# Patient Record
Sex: Male | Born: 1940
Health system: Southern US, Community
[De-identification: ages and names within clinical notes are randomized; demographics above are authoritative.]

## PROBLEM LIST (undated history)

## (undated) DIAGNOSIS — Z923 Personal history of irradiation: Secondary | ICD-10-CM

## (undated) DIAGNOSIS — L0201 Cutaneous abscess of face: Secondary | ICD-10-CM

## (undated) DIAGNOSIS — C07 Malignant neoplasm of parotid gland: Secondary | ICD-10-CM

## (undated) DIAGNOSIS — M1990 Unspecified osteoarthritis, unspecified site: Secondary | ICD-10-CM

## (undated) DIAGNOSIS — B029 Zoster without complications: Secondary | ICD-10-CM

## (undated) HISTORY — PX: WISDOM TOOTH EXTRACTION: SHX21

## (undated) HISTORY — PX: TONSILLECTOMY: SUR1361

## (undated) HISTORY — PX: MOUTH SURGERY: SHX715

## (undated) HISTORY — PX: OTHER SURGICAL HISTORY: SHX169

## (undated) HISTORY — PX: MOHS SURGERY: SUR867

---

## 2002-02-04 DIAGNOSIS — B029 Zoster without complications: Secondary | ICD-10-CM

## 2002-02-04 HISTORY — DX: Zoster without complications: B02.9

## 2003-01-25 ENCOUNTER — Encounter: Admission: RE | Admit: 2003-01-25 | Discharge: 2003-01-25 | Payer: Self-pay | Admitting: Family Medicine

## 2003-02-07 ENCOUNTER — Encounter: Admission: RE | Admit: 2003-02-07 | Discharge: 2003-02-07 | Payer: Self-pay | Admitting: General Surgery

## 2006-11-01 ENCOUNTER — Emergency Department (HOSPITAL_COMMUNITY): Admission: EM | Admit: 2006-11-01 | Discharge: 2006-11-02 | Payer: Self-pay | Admitting: Emergency Medicine

## 2014-08-02 ENCOUNTER — Encounter: Payer: Self-pay | Admitting: *Deleted

## 2015-08-02 DIAGNOSIS — Z83511 Family history of glaucoma: Secondary | ICD-10-CM | POA: Diagnosis not present

## 2015-08-02 DIAGNOSIS — H40013 Open angle with borderline findings, low risk, bilateral: Secondary | ICD-10-CM | POA: Diagnosis not present

## 2015-08-02 DIAGNOSIS — H40053 Ocular hypertension, bilateral: Secondary | ICD-10-CM | POA: Diagnosis not present

## 2017-01-08 DIAGNOSIS — L0201 Cutaneous abscess of face: Secondary | ICD-10-CM

## 2017-01-08 HISTORY — DX: Cutaneous abscess of face: L02.01

## 2017-01-20 DIAGNOSIS — L0201 Cutaneous abscess of face: Secondary | ICD-10-CM | POA: Diagnosis not present

## 2017-01-20 DIAGNOSIS — C44329 Squamous cell carcinoma of skin of other parts of face: Secondary | ICD-10-CM | POA: Diagnosis not present

## 2017-01-20 DIAGNOSIS — H6002 Abscess of left external ear: Secondary | ICD-10-CM | POA: Diagnosis not present

## 2017-02-04 DIAGNOSIS — C4490 Unspecified malignant neoplasm of skin, unspecified: Secondary | ICD-10-CM

## 2017-02-04 HISTORY — DX: Unspecified malignant neoplasm of skin, unspecified: C44.90

## 2017-02-11 DIAGNOSIS — C07 Malignant neoplasm of parotid gland: Secondary | ICD-10-CM | POA: Diagnosis not present

## 2017-02-11 HISTORY — DX: Malignant neoplasm of parotid gland: C07

## 2017-02-12 ENCOUNTER — Other Ambulatory Visit: Payer: Self-pay | Admitting: Otolaryngology

## 2017-02-12 DIAGNOSIS — C07 Malignant neoplasm of parotid gland: Secondary | ICD-10-CM

## 2017-02-17 ENCOUNTER — Ambulatory Visit
Admission: RE | Admit: 2017-02-17 | Discharge: 2017-02-17 | Disposition: A | Discharge status: Home / Self Care | Payer: Medicare Other | Source: Ambulatory Visit | Attending: Otolaryngology | Admitting: Otolaryngology

## 2017-02-17 DIAGNOSIS — C07 Malignant neoplasm of parotid gland: Secondary | ICD-10-CM

## 2017-02-17 MED ORDER — IOPAMIDOL (ISOVUE-300) INJECTION 61%
75.0000 mL | Freq: Once | INTRAVENOUS | Status: AC | PRN
  Administered 2017-02-17: 75 mL via INTRAVENOUS

## 2017-02-19 DIAGNOSIS — C44309 Unspecified malignant neoplasm of skin of other parts of face: Secondary | ICD-10-CM | POA: Diagnosis not present

## 2017-02-19 DIAGNOSIS — Z01818 Encounter for other preprocedural examination: Secondary | ICD-10-CM | POA: Diagnosis not present

## 2017-02-21 DIAGNOSIS — Z01818 Encounter for other preprocedural examination: Secondary | ICD-10-CM | POA: Diagnosis not present

## 2017-02-21 DIAGNOSIS — C44300 Unspecified malignant neoplasm of skin of unspecified part of face: Secondary | ICD-10-CM | POA: Diagnosis not present

## 2017-02-25 DIAGNOSIS — C089 Malignant neoplasm of major salivary gland, unspecified: Secondary | ICD-10-CM | POA: Diagnosis not present

## 2017-02-25 DIAGNOSIS — C44229 Squamous cell carcinoma of skin of left ear and external auricular canal: Secondary | ICD-10-CM | POA: Diagnosis not present

## 2017-02-25 DIAGNOSIS — C07 Malignant neoplasm of parotid gland: Secondary | ICD-10-CM | POA: Diagnosis not present

## 2017-02-25 DIAGNOSIS — D360 Benign neoplasm of lymph nodes: Secondary | ICD-10-CM | POA: Diagnosis not present

## 2017-02-25 DIAGNOSIS — L989 Disorder of the skin and subcutaneous tissue, unspecified: Secondary | ICD-10-CM | POA: Diagnosis not present

## 2017-02-25 DIAGNOSIS — C44329 Squamous cell carcinoma of skin of other parts of face: Secondary | ICD-10-CM | POA: Diagnosis not present

## 2017-02-25 HISTORY — PX: OTHER SURGICAL HISTORY: SHX169

## 2017-02-25 HISTORY — PX: PAROTIDECTOMY: SUR1003

## 2017-02-25 HISTORY — PX: NECK DISSECTION: SUR422

## 2017-03-10 ENCOUNTER — Encounter: Payer: Self-pay | Admitting: Radiation Oncology

## 2017-03-13 NOTE — Progress Notes (Signed)
Head and Neck Cancer Location of Tumor / Histology:  02/25/17 FINAL PATHOLOGIC DIAGNOSIS MICROSCOPIC EXAMINATION AND DIAGNOSIS B.LEFT PREAURICULAR SKIN CARCINOMA AND PAROTIDECTOMY, RESECTION OF SKIN AND SOFT TISSUE AND TOTAL PAROTIDECTOMY: Squamous cell carcinoma, moderately differentiated, invasive of the preauricular skin to involve the deep subcutaneous tissues and the parotid glandular tissue. Tumor size: 2.0 x 1.6 x 1.1 cm Perineural invasion identified. Nine regional lymph nodes all negative for malignancy (0/9). All skin and soft tissue margins are negative for malignancy. See tumor protocol summary. A.LEFT EAR CANAL MARGIN, EXCISION: Negative for malignancy. C.LEFT NECK CONTENTS, LEVEL IIB LYMPH NODES, DISSECTION: Three lymph nodes all negative for tumor (0/3). D.LEFT NECK CONTENTS, LEVEL I, 2A, 3 LYMPH NODES, DISSECTION: Thirteen lymph nodes all negative for tumor (0/13).  Patient presented with symptoms of: He presented to Dr. Janace Hoard on 01/08/17 with a left facial lesion that had been present for several months that had become more painful and enlarged 2 weeks before the visit with Dr. Janace Hoard. He was referred to Dr. Conley Canal and saw him initially on 02/19/17 for evaluation for surgery.   Biopsies revealed: Squamous cell carcinoma, moderately differentiated, invasive of the preauricular skin to involve thedeep subcutaneous tissues and the parotid glandular tissue.  Nutrition Status Yes No Comments  Weight changes? []  [x]    Swallowing concerns? []  [x]    PEG? []  [x]     Referrals Yes No Comments  Social Work? []  [x]    Dentistry? []  [x]    Swallowing therapy? []  [x]    Nutrition? []  [x]    Med/Onc? []  [x]     Safety Issues Yes No Comments  Prior radiation? []  [x]    Pacemaker/ICD? []  [x]    Possible current pregnancy? []  [x]    Is the patient on methotrexate? []  [x]     Tobacco/Marijuana/Snuff/ETOH use: He has never  smoked or used smokeless tobacco. He has not had any alcohol since his surgery 02/25/17  Past/Anticipated interventions by otolaryngology, if any:  02/25/17 Dr. Conley Canal Procedures/Surgeries performed during hospitalization:  PR EXC PAROTD,LAT LOBE,DISSECT 5TH NERV [42415] (PAROTIDECTOMY) AURICULECTOMY CERVICOFACIAL ROTATION ADVANCEMENT FLAP    Past/Anticipated interventions by medical oncology, if any:  N/A   Current Complaints / other details:   He reports a "sensitive area" to his surgical site when shaving. His site has healed well.   02/17/17 CT neck IMPRESSION: 1.9 x 1.7 x 2.5 cm irregularly marginated mass anterior to the left ear. Inseparable from the upper lateral corner the superficial lobe of left parotid gland. The lesion could have arisen within the parotid gland or, more likely, within the adjacent soft tissues. No evidence of pathologic nodes by CT size criteria. Small node just posterior and inferior to the left parotid gland viewed with mild suspicion based on mild enhancement  BP (!) 144/92   Pulse 74   Temp 98 F (36.7 C)   Ht 6' (1.829 m)   Wt 195 lb 9.6 oz (88.7 kg)   SpO2 99% Comment: room air  BMI 26.53 kg/m    Wt Readings from Last 3 Encounters:  03/18/17 195 lb 9.6 oz (88.7 kg)

## 2017-03-14 ENCOUNTER — Encounter: Payer: Self-pay | Admitting: Radiation Oncology

## 2017-03-15 ENCOUNTER — Telehealth: Payer: Self-pay | Admitting: *Deleted

## 2017-03-15 NOTE — Telephone Encounter (Signed)
Oncology Nurse Navigator Documentation  Placed introductory call to new referral patient.  Introduced myself as the H&N oncology nurse navigator that works with Dr. Isidore Moos to whom he has been referred by Dr. Conley Canal, [facility].  He confirmed understanding of referral and appt date/time of 2/12 12:30 for NE, 1:00 [facility] consult.  I briefly explained my role as his navigator, indicated I would be joining him during his appt next week.  I confirmed his understanding of [place] location, explained arrival and RadOnc registration process for appt.  Answered his questions re RT.  I provided my contact information, encouraged him to call with questions/concerns before next week.  He verbalized understanding of information provided, expressed appreciation for my call.  Gayleen Orem, RN, BSN Head & Neck Oncology Nurse [place] at [place] [phone]

## 2017-03-18 ENCOUNTER — Ambulatory Visit
Admission: RE | Admit: 2017-03-18 | Discharge: 2017-03-18 | Disposition: A | Discharge status: Home / Self Care | Payer: Medicare Other | Source: Ambulatory Visit | Attending: Radiation Oncology | Admitting: Radiation Oncology

## 2017-03-18 ENCOUNTER — Encounter: Payer: Self-pay | Admitting: Radiation Oncology

## 2017-03-18 ENCOUNTER — Encounter: Payer: Self-pay | Admitting: *Deleted

## 2017-03-18 VITALS — BP 144/92 | HR 74 | Temp 98.0°F | Ht 72.0 in | Wt 195.6 lb

## 2017-03-18 DIAGNOSIS — Z8619 Personal history of other infectious and parasitic diseases: Secondary | ICD-10-CM | POA: Diagnosis not present

## 2017-03-18 DIAGNOSIS — C44300 Unspecified malignant neoplasm of skin of unspecified part of face: Secondary | ICD-10-CM

## 2017-03-18 DIAGNOSIS — R200 Anesthesia of skin: Secondary | ICD-10-CM | POA: Diagnosis not present

## 2017-03-18 DIAGNOSIS — C4490 Unspecified malignant neoplasm of skin, unspecified: Secondary | ICD-10-CM | POA: Diagnosis not present

## 2017-03-18 DIAGNOSIS — Z1329 Encounter for screening for other suspected endocrine disorder: Secondary | ICD-10-CM

## 2017-03-18 DIAGNOSIS — J358 Other chronic diseases of tonsils and adenoids: Secondary | ICD-10-CM | POA: Diagnosis not present

## 2017-03-18 DIAGNOSIS — C44329 Squamous cell carcinoma of skin of other parts of face: Secondary | ICD-10-CM | POA: Diagnosis not present

## 2017-03-18 DIAGNOSIS — Z9049 Acquired absence of other specified parts of digestive tract: Secondary | ICD-10-CM | POA: Diagnosis not present

## 2017-03-18 HISTORY — DX: Zoster without complications: B02.9

## 2017-03-18 HISTORY — DX: Cutaneous abscess of face: L02.01

## 2017-03-18 HISTORY — DX: Malignant neoplasm of parotid gland: C07

## 2017-03-18 NOTE — Progress Notes (Signed)
Radiation Oncology         [phone] ________________________________  Initial outpatient Consultation  Name: Timothy Weaver MRN: [id number]  Date: 03/18/2017  DOB: 1940/10/17  VZ:DGLOVFI, No Pcp Per  [name], *   REFERRING PHYSICIAN: [name], *  DIAGNOSIS:    ICD-10-CM   1. Skin cancer of face C44.300 TSH    CBC with Differential/Platelet    Basic metabolic panel    CT Chest W Contrast    Ambulatory referral to Social Work    Ambulatory referral to Physical Therapy    Amb Referral to Nutrition and Diabetic E    Ambulatory referral to Dentistry  2. Screening for hypothyroidism Z13.29 TSH  3. Cancer of skin of face C44.300    Skin cancer of the face invading the parotid gland on the left Cancer Staging Cancer of skin of face Staging form: Cutaneous Carcinoma of the Head and Neck, AJCC 8th Edition - Clinical: Stage III (cT3, cN0, cM0) - Signed by Eppie Gibson, MD on 03/19/2017 +PNI, negative LVSI, moderately differentiated, negative margins  CHIEF COMPLAINT: Here to discuss management of his squamous cell cancer of the parotid.  HISTORY OF PRESENT ILLNESS::Timothy Weaver is a 77 y.o. male who presented to ENT on 01/08/17 with a left preauricular lesion (small, mobile, oblong cyst in appearance and initial subcutaneous) present for several months. The lesion had been enlarging and growing more painful for the prior 2 weeks, prompting patient's visit. Noted during this first visit was increasing erythema, reports of spontaneous drainage, and increasing swelling. Patient was given abx and returned for f/u on 01/20/17 and underwent I&D. Pathology showed squamous cell carcinoma and patient was referred to Dr. Conley Canal on 02/19/17.  Pertinent imaging thus far includes neck CT performed on 02/17/17 revealing 1.9 x 1.7 x 2.5 cm irregularly marginated mass anterior to the left ear. Inseparable from the upper lateral corner of the superficial lobe of left  parotid gland. No evidence of pathologic nodes by CT size criteria. Small node just posterior and inferior to the left parotid gland viewed with mild suspicion based on mild enhancement. The patient has not had any CT imaging of his chest recently.  I have personally reviewed the patient's images.  Dr. Conley Canal assessed that the patient had squamous cell carcinoma of the skin which had deep extension to the parotid.  On 02/25/17 he performed a left preauricular skin carcinoma and parotidectomy, resection of skin and soft tissue and total parotidectomy. Pathology showed squamous cell carcinoma, moderately differentiated, invasive with tumor size 2.0 x 1.6 x 1.1 cm with perineural invasion. 0/25 lymph nodes were positive for malignancy. All margins were clear by at least 0.6 cm.  Swallowing issues, if any: Denies.  Weight Changes: Denies.  Pain status: Endorses some pain in his left ear since surgery.  Tobacco history, if any: Denies.  ETOH abuse, if any: None since surgery Jan 2019.  Prior cancers, if any: None  On review of systems, patient endorses swelling and redness around time of initial consults. Patient denies any history of skin cancer prior to this case. Denies recent full body skin exam by dermatologist. Denies any numbness, tingling, or weakness in face before surgery. Endorses tingling sensation where hair was shaved at left temporal scalp. Endorses numbness over left cheek since surgery. Denies any history of diabetes.  Reports significant sunburns in early adulthood and as a child.  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    PAST SURGICAL HISTORY: Past Surgical History:  Procedure Laterality Date  . auriculectomy Left 02/25/2017   Dr. [name]- [facility].   . hearing loss    . MOUTH SURGERY    . NECK DISSECTION Left  02/25/2017   Dr. [name], [facility].   Marland Kitchen PAROTIDECTOMY Left 02/25/2017   Dr. [name] [facility]  . TONSILLECTOMY    . WISDOM TOOTH EXTRACTION      FAMILY HISTORY: he does not report related medical conditions in his family  SOCIAL HISTORY:  reports that  has never smoked. he has never used smokeless tobacco. He reports that he drinks alcohol. He reports that he does not use drugs.  ALLERGIES: Patient has no known allergies.  MEDICATIONS:  Current Outpatient Medications  Medication Sig Dispense Refill  . acetaminophen (TYLENOL) 500 MG tablet Take 500 mg by mouth every 6 (six) hours as needed.    Marland Kitchen ibuprofen (ADVIL,MOTRIN) 400 MG tablet Take 400 mg by mouth every 6 (six) hours as needed.    Marland Kitchen oxyCODONE (OXY IR/ROXICODONE) 5 MG immediate release tablet Take 5 mg by mouth every 4 (four) hours as needed for moderate pain or severe pain. Take one tablet by mouth every 6 hours as needed for up to 7 days for moderate pain or severe pain.     No current facility-administered medications for this encounter.     REVIEW OF SYSTEMS:  Notable for that above.   PHYSICAL EXAM:  height is 6' (1.829 m) and weight is 195 lb 9.6 oz (88.7 kg). His temperature is 98 F (36.7 C). His blood pressure is 144/92 (abnormal) and his pulse is 74. His oxygen saturation is 99%.   General: Alert and oriented, in no acute distress HEENT: Head is normocephalic. Extraocular movements are intact. Oropharynx is notable for small tag on tip of oral tongue secondary to biting several years ago. No notable lesions. Metallic work noted in his teeth.  Left eyebrow does not elevate with right eyebrow. Very slight droop of left mouth. Surgical scar extends from left cheek to the external ear canal and down the cervical neck. The scar is mildly erythematous but is healing well.  Neck: Neck is notable for no palpable masses in periparotid regions or neck bilaterally. Heart: Regular in rate and rhythm with no murmurs,  rubs, or gallops. Chest: Clear to auscultation bilaterally, with no rhonchi, wheezes, or rales. Abdomen: Soft, nontender, nondistended, with no rigidity or guarding. Normal bowel sounds. Extremities: No cyanosis or edema. Lymphatics: see Neck Exam Skin: No concerning lesions. Signs of chronic sun exposure. Musculoskeletal: symmetric strength and muscle tone throughout. Neurologic: Cranial nerves II through XII are grossly intact. No obvious focalities. Speech is fluent. Coordination is intact. Psychiatric: Judgment and insight are intact. Affect is appropriate.   ECOG = 0  0 - Asymptomatic (Fully active, able to carry on all predisease activities without restriction)  1 - Symptomatic but completely ambulatory (Restricted in physically strenuous activity but ambulatory and able to carry out work of a light or sedentary nature. For example, light housework, office work)  2 - Symptomatic, <50% in bed during the day (Ambulatory and capable of all self care but unable to carry out any work activities. Up and about more than 50% of waking hours)  3 - Symptomatic, >50% in bed, but not bedbound (Capable of only limited self-care, confined to bed or chair 50% or more of waking  hours)  4 - Bedbound (Completely disabled. Cannot carry on any self-care. Totally confined to bed or chair)  5 - Death   Eustace Pen MM, Creech RH, Tormey DC, et al. [phone]). "Toxicity and response criteria of the [facility]". [place] Oncol. 5 (6): 649-55   LABORATORY DATA:  No results found for: WBC, HGB, HCT, MCV, PLT CMP  No results found for: NA, K, CL, CO2, GLUCOSE, BUN, CREATININE, CALCIUM, PROT, ALBUMIN, AST, ALT, ALKPHOS, BILITOT, GFRNONAA, GFRAA       RADIOGRAPHY: Ct Soft Tissue Neck W Contrast  Result Date: 02/17/2017 CLINICAL DATA:  New diagnosis of squamous cell carcinoma the parotid gland. Creatinine was obtained on site at [place] at [address] Results:  Creatinine 0.7 mg/dL. EXAM: CT NECK WITH CONTRAST TECHNIQUE: Multidetector CT imaging of the neck was performed using the standard protocol following the bolus administration of intravenous contrast. CONTRAST:  86mL ISOVUE-300 IOPAMIDOL (ISOVUE-300) INJECTION 61% COMPARISON:  None. FINDINGS: Pharynx and larynx: No mucosal or submucosal lesion. Prominence of the right tonsil relative to the left with tonsillar stones. Salivary glands: Right parotid gland is normal. There is a poorly defined mass in the superior aspect of the superficial lobe of the left parotid measuring 1.9 x 1.7 x 2.5 cm in diameter. This could have arisen in the extreme upper superficial corner of the parotid gland or could have arisen in the soft tissues adjacent to the parotid gland. Tumor extends backwards to the anterior wall the external auditory canal. Parotid gland otherwise appears normal with some ordinary appearing parotid nodes, symmetric to the other side. Submandibular glands are normal and symmetric. Thyroid: Normal Lymph nodes: No enlarged or low-density nodes on either side of the neck. Single slightly suspicious node just inferior and posterior to the left parotid gland, axial image 30. This is not enlarged by size criteria but may enhance slightly. Vascular: Normal Limited intracranial: Normal Visualized orbits: Normal Mastoids and visualized paranasal sinuses: Clear Skeleton: Ordinary cervical spondylosis. Upper chest: Normal Other: None IMPRESSION: 1.9 x 1.7 x 2.5 cm irregularly marginated mass anterior to the left ear. Inseparable from the upper lateral corner the superficial lobe of left parotid gland. The lesion could have arisen within the parotid gland or, more likely, within the adjacent soft tissues. No evidence of pathologic nodes by CT size criteria. Small node just posterior and inferior to the left parotid gland viewed with mild suspicion based on mild enhancement. Electronically Signed   By: Nelson Chimes M.D.   On:  02/17/2017 15:40      IMPRESSION/PLAN: This is a delightful patient with head and neck cancer. It is not clear if this was a subcutaneous lymph node or a primary skin cancer.   I recommend adjuvant radiotherapy for this patient and anticipate 6 weeks of treatment to begin within 6 weeks after surgery. I will schedule a chest CT early next week to rule out lung metastases.   He is scheduled to meet with Dr. Conley Canal in ENT next Monday. I will also refer the patient for a full body skin exam with dermatology.  We discussed the potential risks, benefits, and side effects of radiotherapy. We talked in detail about acute and late effects. We discussed that some of the most bothersome acute effects may be mucositis, dysgeusia, salivary changes, skin irritation, hair loss, dehydration, weight loss and fatigue. We talked about late effects which include but are not necessarily limited to dysphagia, hypothyroidism, nerve injury, spinal cord injury, xerostomia,  trismus, and neck edema. No guarantees of treatment were given. A consent form was signed and placed in the patient's medical record. The patient is enthusiastic about proceeding with treatment. I look forward to participating in the patient's care.     Simulation (treatment planning) will take place after dental clearance and CT of chest   We also discussed that the treatment of head and neck cancer is a multidisciplinary process to maximize treatment outcomes and quality of life. For this reasons the following referrals have been or will be made:   Dermatology referral for full body skin exam    Dentistry for dental evaluation, possible scatter guards, advice on reducing risk of cavities, osteoradionecrosis, or other oral issues. I don't think extractions will be warranted, as the RT will not deliver high doses to tooth roots.   Nutritionist for nutrition support during and after treatment.   Social work for social support.    Physical therapy  due to risk of lymphedema in neck and deconditioning.   Baseline labs including TSH.  I spent 50 minutes face to face with the patient, over 50% of the time on counseling and care coordination. __________________________________________   Eppie Gibson, MD   This document serves as a record of services personally performed by Eppie Gibson, MD. It was created on his behalf by Linward Natal, a trained medical scribe. The creation of this record is based on the scribe's personal observations and the provider's statements to them. This document has been checked and approved by the attending provider.

## 2017-03-18 NOTE — Progress Notes (Signed)
Oncology Nurse Navigator Documentation  Met with patient during initial consult with Dr. Isidore Moos.  He was accompanied by his wife.  1. Further introduced myself as his Navigator, explained my role as a member of the Care Team.   2. Provided New Patient Information packet, discussed contents:  Contact information for physician(s), myself, other members of the Care Team.  Advance Directive information ([place] blue pamphlet with LCSW contact info)  Fall Prevention Patient [place] sheet  [place] campus map with highlight of [place] 3. He stated he has post-surgical follow-up with Dr. Conley Canal, [facility], 2/18. 4. Provided introductory explanation of radiation treatment including SIM planning and purpose of Aquaplast head and shoulder mask, showed them example.   5. Discussed his attendance at 2/26 H&N [place].  He voiced understanding I will provide additional information. 6. Provided a tour of SIM and [place] areas, explained treatment and arrival procedures. 7. I encouraged them to contact me with questions/concerns as treatments/procedures begin.  They verbalized understanding of information provided.    Gayleen Orem, RN, BSN Head & Neck Oncology Nurse [place] at [place] [phone]

## 2017-03-19 ENCOUNTER — Encounter: Payer: Self-pay | Admitting: Radiation Oncology

## 2017-03-19 ENCOUNTER — Other Ambulatory Visit: Payer: Self-pay | Admitting: Radiation Oncology

## 2017-03-19 DIAGNOSIS — C44300 Unspecified malignant neoplasm of skin of unspecified part of face: Secondary | ICD-10-CM | POA: Insufficient documentation

## 2017-03-20 ENCOUNTER — Ambulatory Visit (HOSPITAL_COMMUNITY): Payer: Self-pay | Admitting: Dentistry

## 2017-03-20 ENCOUNTER — Encounter (HOSPITAL_COMMUNITY): Payer: Self-pay | Admitting: Dentistry

## 2017-03-20 ENCOUNTER — Other Ambulatory Visit: Payer: Self-pay | Admitting: Radiation Oncology

## 2017-03-20 ENCOUNTER — Telehealth: Payer: Self-pay | Admitting: *Deleted

## 2017-03-20 ENCOUNTER — Other Ambulatory Visit (HOSPITAL_COMMUNITY): Payer: Medicare Other | Admitting: Dentistry

## 2017-03-20 VITALS — BP 144/90 | HR 70 | Temp 98.0°F

## 2017-03-20 DIAGNOSIS — K0530 Chronic periodontitis, unspecified: Secondary | ICD-10-CM

## 2017-03-20 DIAGNOSIS — K08409 Partial loss of teeth, unspecified cause, unspecified class: Secondary | ICD-10-CM

## 2017-03-20 DIAGNOSIS — K031 Abrasion of teeth: Secondary | ICD-10-CM

## 2017-03-20 DIAGNOSIS — C44300 Unspecified malignant neoplasm of skin of unspecified part of face: Secondary | ICD-10-CM

## 2017-03-20 DIAGNOSIS — C44329 Squamous cell carcinoma of skin of other parts of face: Secondary | ICD-10-CM | POA: Diagnosis not present

## 2017-03-20 DIAGNOSIS — Z01818 Encounter for other preprocedural examination: Secondary | ICD-10-CM

## 2017-03-20 DIAGNOSIS — K036 Deposits [accretions] on teeth: Secondary | ICD-10-CM

## 2017-03-20 DIAGNOSIS — Z0120 Encounter for dental examination and cleaning without abnormal findings: Secondary | ICD-10-CM | POA: Diagnosis not present

## 2017-03-20 DIAGNOSIS — K06010 Localized gingival recession, unspecified: Secondary | ICD-10-CM

## 2017-03-20 MED ORDER — SODIUM FLUORIDE 1.1 % DT GEL: DENTAL | 11 refills | Status: DC

## 2017-03-20 MED FILL — SF 1.1% GEL: 1.1 | 30 days supply | Qty: 112 | Fill #0

## 2017-03-20 NOTE — Patient Instructions (Signed)

## 2017-03-20 NOTE — Telephone Encounter (Signed)
Called patient to inform of appt. for Ct on 03-25-17- arrival time - 2:15 pm , pt. To have water only - 4 hrs. Prior to test, test to be @ [facility] Radiology pt. To have labs on 03-25-17 @ 1:15 pm @ [place], and patient to have a consultation on 06-18-17 - arrival time - 2:40 pm @ Dr. Wilhemina Bonito' Office, address- [place], ph. No. [phone], lvm for a return call

## 2017-03-20 NOTE — Progress Notes (Signed)
DENTAL CONSULTATION  Date of Consultation:  03/20/2017 Patient Name:   Timothy Weaver Date of Birth:   Jun 16, 1940 Medical Record Number: [id number]  VITALS: BP (!) 144/90 (BP Location: Right Arm)   Pulse 70   Temp 98 F (36.7 C)   CHIEF COMPLAINT: Patient referred by Dr. Isidore Moos for a dental consultation.  HPI: Timothy Weaver is a 77 year old male recently diagnosed with cancer involving the skin of the face. Patient underwent surgical resection with Dr. Conley Canal at [facility]. Patient with anticipated radiation therapy with Dr. Isidore Moos. Patient now seen as part of a medically necessary preradiation therapy dental protocol examination.  The patient currently denies acute toothaches, swellings, or abscesses. Patient was last seen in November 2018 for an exam and cleaning with Dr. Nicki Reaper Minor.  The patient is usually seen on an every 6 month basis. Patient's last dental radiographs were a full series taken in July of 2014.  Patient denies having partial dentures. Patient denies having any unmet dental needs. Patient denies having dental phobia.  Patient indicates that he had root canal therapy on tooth #30 in August 2014 with Dr. Sue Lush.  The patient denies having any sensitivity to percussion or palpation in this area. The patient is aware of the multiple flexure lesions in his mouth at this time.   PROBLEM LIST: Patient Active Problem List   Diagnosis Date Noted  . Cancer of skin of face 03/19/2017    PMH: Past Medical History:  Diagnosis Date  . Facial abscess 01/08/2017  . Shingles 2004  . 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 auriculectomy Left 02/25/2017   Dr. [name]- [facility].   . hearing loss    . MOUTH SURGERY    . NECK DISSECTION Left 02/25/2017   Dr. [name], [facility].   Marland Kitchen PAROTIDECTOMY Left 02/25/2017   Dr. [name] [facility]  . TONSILLECTOMY    .  WISDOM TOOTH EXTRACTION      ALLERGIES: No Known Allergies  MEDICATIONS: Current Outpatient Medications  Medication Sig Dispense Refill  . acetaminophen (TYLENOL) 500 MG tablet Take 500 mg by mouth every 6 (six) hours as needed.    Marland Kitchen ibuprofen (ADVIL,MOTRIN) 400 MG tablet Take 400 mg by mouth every 6 (six) hours as needed.    Marland Kitchen oxyCODONE (OXY IR/ROXICODONE) 5 MG immediate release tablet Take 5 mg by mouth every 4 (four) hours as needed for moderate pain or severe pain. Take one tablet by mouth every 6 hours as needed for up to 7 days for moderate pain or severe pain.     No current facility-administered medications for this visit.     LABS: No results found for: WBC, HGB, HCT, MCV, PLT No results found for: NA, K, CL, CO2, GLUCOSE, BUN, CREATININE, CALCIUM, GFRNONAA, GFRAA No results found for: INR, PROTIME No results found for: PTT  SOCIAL HISTORY: Social History   Socioeconomic History  . Marital status: Married    Spouse name: Not on file  . Number of children: Not on file  . Years of education: Not on file  . Highest education level: Not on file  Social Needs  . Financial resource strain: Not on file  . Food insecurity - worry: Not on file  . Food insecurity - inability: Not on file  . Transportation needs - medical: Not on file  . Transportation needs - non-medical: Not on file  Occupational History  . Not  on file  Tobacco Use  . Smoking status: Never Smoker  . Smokeless tobacco: Never Used  Substance and Sexual Activity  . Alcohol use: Yes    Comment: occasional. not since surgery 02/2017  . Drug use: No  . Sexual activity: Not on file  Other Topics Concern  . Not on file  Social History Narrative  . Not on file    FAMILY HISTORY: History reviewed. No pertinent family history.  REVIEW OF SYSTEMS: Reviewed with the patient as per History of present illness. Psych: Patient denies having dental phobia.  DENTAL HISTORY: CHIEF COMPLAINT: Patient referred  by Dr. Isidore Moos for a dental consultation.  HPI: Timothy Weaver is a 77 year old male recently diagnosed with cancer involving the skin of the face. Patient underwent surgical resection with Dr. Conley Canal at [facility]. Patient with anticipated radiation therapy with Dr. Isidore Moos. Patient now seen as part of a medically necessary preradiation therapy dental protocol examination.  The patient currently denies acute toothaches, swellings, or abscesses. Patient was last seen in November 2018 for an exam and cleaning with Dr. Nicki Reaper Minor.  The patient is usually seen on an every 6 month basis. Patient's last dental radiographs were a full series taken in July of 2014.  Patient denies having partial dentures. Patient denies having any unmet dental needs. Patient denies having dental phobia.  Patient indicates that he had root canal therapy on tooth #30 in August 2014 with Dr. Sue Lush.  The patient denies having any sensitivity to percussion or palpation in this area. The patient is aware of the multiple flexure lesions in his mouth at this time.   DENTAL EXAMINATION: GENERAL:  The patient is a well-developed, well-nourished male in no acute distress. HEAD AND NECK:  The left neck and face are consistent with previous surgical resection by Dr. Conley Canal. There is no right neck lymphadenopathy. The patient denies acute TMJ symptoms. INTRAORAL EXAM:  The patient has normal saliva. There is no evidence of oral abscess formation. DENTITION:  Patient is missing tooth numbers 1, 16, 17, and 32 that were extracted when he was approximately 77 years of age. PERIODONTAL:  Patient has chronic periodontitis with no significant plaque accumulations, selective areas of gingival recession, and no significant tooth mobility. DENTAL CARIES/SUBOPTIMAL RESTORATIONS: There are no obvious dental caries are noted. Patient has multiple flexure lesions associated with tooth numbers 4, 5, 6, 11, 19, 20, 21, 28, and  29. ENDODONTIC:  Patient currently denies acute pulpitis symptoms. Patient has previous root canal therapy associated with tooth #30. There appears to be a persistent periapical radiolucency associated with the apex of tooth #30. Patient again denies any sensitivity to percussion or palpation. I had a discussion with Dr. Sue Lush who performed root canal therapy in August 2014. Patient did have a significant periapical radiolucency at that time.  Dr. Sue Lush is willing to reevaluate the tooth for additional therapy if indicated. CROWN AND BRIDGE:  The patient has multiple crown restorations on tooth numbers 3, 12, 13, 14, 30, and 31. PROSTHODONTIC:  There are no partial dentures. OCCLUSION:  The patient has a stable occlusion.  RADIOGRAPHIC INTERPRETATION: An orthopantogram was taken and supplemented with a full series of dental radiographs. There are missing tooth numbers 1, 16, 17, and 32. There is incipient bone loss noted. There is a persistent periapical radiolucency at the apex of tooth #30. Tooth #30 as had a previous root canal therapy. Multiple resin, amalgams, and crown and bridge restorations are noted.   ASSESSMENTS: 1.  Cancer of the skin of the left face-status post surgical resection. 2. Preradiation therapy dental protocol 3. Chronic periodontitis with minimal accretions 4. Missing tooth numbers 1, 16, 17, and 32. 5. Multiple flexure lesions 6. Persistent periapical radiolucency at the apex of tooth #30 after root canal therapy in 2014 with no acute symptoms. 7. Stable occlusion  PLAN/RECOMMENDATIONS: 1. I discussed the risks, benefits, and complications of various treatment options with the patient in relationship to his medical and dental conditions, anticipated radiation therapy, and radiation therapy side effects to include xerostomia, radiation caries, trismus, mucositis, taste changes, gum and jawbone changes, and risk for infection and osteoradionecrosis. We discussed  various treatment options to include no treatment, periodontal therapy, dental restorations, root canal therapy, crown and bridge therapy, implant therapy, and replacement of missing teeth as indicated. We also discussed fabrication of fluoride trays and scatter protection devices. We also discussed referral to an endodontist for reevaluation of the root canal therapy on tooth #30.  Patient currently wishes to defer referral to the endodontist at this time. The patient did wish to proceed with impressions today for the future fabrication of fluoride trays and scatter protection devices.  Patient is to return to Dental Medicine for Tuesday, 03/25/2017 at 8:30 am. A prescription for FluoriSHIELD sodium fluoride was sent to [facility] with refills for one year. The patient expresses understanding with the need for evaluation for restoration of his flexure lesions with Dr. Nicki Reaper Minor once he is medically stable from the anticipated radiation therapy.  Patient also is aware of the need to follow-up with  Dr. Sue Lush for evaluation of the previous root canal therapy in the area of tooth #30 after the radiation therapy.  2. Discussion of findings with medical team and coordination of future medical and dental care as needed.  I spent in excess of  120 minutes during the conduct of this consultation and >50% of this time involved direct face-to-face encounter for counseling and/or coordination of the patient's care.    Lenn Cal, DDS

## 2017-03-25 ENCOUNTER — Ambulatory Visit (HOSPITAL_COMMUNITY)
Admission: RE | Admit: 2017-03-25 | Discharge: 2017-03-25 | Disposition: A | Discharge status: Home / Self Care | Payer: Medicare Other | Source: Ambulatory Visit | Attending: Radiation Oncology | Admitting: Radiation Oncology

## 2017-03-25 ENCOUNTER — Encounter (HOSPITAL_COMMUNITY): Payer: Self-pay

## 2017-03-25 ENCOUNTER — Encounter (HOSPITAL_COMMUNITY): Payer: Self-pay | Admitting: Dentistry

## 2017-03-25 ENCOUNTER — Ambulatory Visit
Admission: RE | Admit: 2017-03-25 | Discharge: 2017-03-25 | Disposition: A | Discharge status: Home / Self Care | Payer: Medicare Other | Source: Ambulatory Visit | Attending: Radiation Oncology | Admitting: Radiation Oncology

## 2017-03-25 ENCOUNTER — Ambulatory Visit (HOSPITAL_COMMUNITY): Payer: Medicaid - Dental | Admitting: Dentistry

## 2017-03-25 VITALS — BP 147/82 | HR 68 | Temp 98.2°F

## 2017-03-25 DIAGNOSIS — Z463 Encounter for fitting and adjustment of dental prosthetic device: Secondary | ICD-10-CM

## 2017-03-25 DIAGNOSIS — Z1329 Encounter for screening for other suspected endocrine disorder: Secondary | ICD-10-CM

## 2017-03-25 DIAGNOSIS — Z510 Encounter for antineoplastic radiation therapy: Secondary | ICD-10-CM | POA: Insufficient documentation

## 2017-03-25 DIAGNOSIS — Z01818 Encounter for other preprocedural examination: Secondary | ICD-10-CM

## 2017-03-25 DIAGNOSIS — C44300 Unspecified malignant neoplasm of skin of unspecified part of face: Secondary | ICD-10-CM

## 2017-03-25 DIAGNOSIS — I700 Atherosclerosis of aorta: Secondary | ICD-10-CM | POA: Insufficient documentation

## 2017-03-25 DIAGNOSIS — C44309 Unspecified malignant neoplasm of skin of other parts of face: Secondary | ICD-10-CM | POA: Diagnosis not present

## 2017-03-25 DIAGNOSIS — D110 Benign neoplasm of parotid gland: Secondary | ICD-10-CM | POA: Diagnosis not present

## 2017-03-25 LAB — CBC WITH DIFFERENTIAL/PLATELET
Basophils Absolute: 0.1 10*3/uL (ref 0.0–0.1)
Basophils Relative: 1 %
Eosinophils Absolute: 0.5 10*3/uL (ref 0.0–0.5)
Eosinophils Relative: 8 %
HCT: 45.2 % (ref 38.4–49.9)
Hemoglobin: 14.8 g/dL (ref 13.0–17.1)
Lymphocytes Relative: 28 %
Lymphs Abs: 1.8 10*3/uL (ref 0.9–3.3)
MCH: 29.5 pg (ref 27.2–33.4)
MCHC: 32.7 g/dL (ref 32.0–36.0)
MCV: 90.2 fL (ref 79.3–98.0)
Monocytes Absolute: 0.5 10*3/uL (ref 0.1–0.9)
Monocytes Relative: 8 %
Neutro Abs: 3.6 10*3/uL (ref 1.5–6.5)
Neutrophils Relative %: 55 %
Platelets: 270 10*3/uL (ref 140–400)
RBC: 5.01 MIL/uL (ref 4.20–5.82)
RDW: 13.4 % (ref 11.0–14.6)
WBC: 6.6 10*3/uL (ref 4.0–10.3)

## 2017-03-25 LAB — BASIC METABOLIC PANEL
Anion gap: 9 (ref 3–11)
BUN: 20 mg/dL (ref 7–26)
CO2: 24 mmol/L (ref 22–29)
Calcium: 9.4 mg/dL (ref 8.4–10.4)
Chloride: 107 mmol/L (ref 98–109)
Creatinine, Ser: 0.84 mg/dL (ref 0.70–1.30)
GFR calc Af Amer: >60 mL/min (ref >60)
GFR calc non Af Amer: >60 mL/min (ref >60)
Glucose, Bld: 98 mg/dL (ref 70–140)
Potassium: 4.2 mmol/L (ref 3.5–5.1)
Sodium: 140 mmol/L (ref 136–145)

## 2017-03-25 LAB — TSH: TSH: 1.091 u[IU]/mL (ref 0.320–4.118)

## 2017-03-25 MED ORDER — IOPAMIDOL (ISOVUE-300) INJECTION 61%
INTRAVENOUS | Status: AC
  Administered 2017-03-25: 75 mL via INTRAVENOUS
  Filled 2017-03-25: qty 75

## 2017-03-25 MED ORDER — IOPAMIDOL (ISOVUE-300) INJECTION 61%
75.0000 mL | Freq: Once | INTRAVENOUS | Status: AC | PRN
  Administered 2017-03-25: 75 mL via INTRAVENOUS

## 2017-03-25 NOTE — Patient Instructions (Signed)

## 2017-03-25 NOTE — Progress Notes (Signed)
03/25/2017  Patient Name:   Timothy Weaver Date of Birth:   1941/01/25 Medical Record Number: [id number]  BP (!) 147/82 (BP Location: Right Arm)   Pulse 68   Temp 98.2 F (36.8 C)   Lido L Moster now presents for insertion of upper and lower fluoride trays and scatter protection devices.  PROCEDURE: Appliances were tried in and adjusted as needed. [place] Trismus device was fabricated 45 mm using 25 sticks. Postop instructions were provided and a written and verbal format concerning the use and care of appliances. All questions were answered. Patient to return to clinic for periodic oral examination in approximately 2-3 weeks during radiation therapy. Patient to call if questions or problems arise before then.  Lenn Cal, DDS

## 2017-03-27 ENCOUNTER — Telehealth: Payer: Self-pay | Admitting: *Deleted

## 2017-03-27 NOTE — Telephone Encounter (Addendum)
Oncology Nurse Navigator Documentation  Called Mr. Timothy Weaver to inform him:  Per Dr. Isidore Moos, 2/19 CT Chest results; he expressed relief and appreciation for information.    Recommendation for baseline audiology evaluation noting he will be contacted by [facility] to arrange appt.  CT SIM appt 2/27 1:00. Confirmed 0900 arrival next Tuesday morning for H&N MDC, explained registration/arrival procedures.  Gayleen Orem, RN, BSN Head & Neck Oncology Nurse [place] at [place] [phone]

## 2017-03-28 DIAGNOSIS — H903 Sensorineural hearing loss, bilateral: Secondary | ICD-10-CM | POA: Diagnosis not present

## 2017-03-31 ENCOUNTER — Telehealth: Payer: Self-pay | Admitting: *Deleted

## 2017-03-31 NOTE — Telephone Encounter (Signed)
Oncology Nurse Navigator Documentation  Confirmed with patient 0900 arrival tomorrow for H&N MDC, reviewed registration procedure. He noted completion of pre-RT audiology evaluation 2/22.  I confirmed results availability in Care Everywhere, Dr. Isidore Moos notified.  Gayleen Orem, RN, BSN Head & Neck Oncology Nurse [place] at [place] [phone]

## 2017-04-01 ENCOUNTER — Encounter: Payer: Self-pay | Admitting: Radiation Oncology

## 2017-04-01 ENCOUNTER — Ambulatory Visit: Payer: Medicare Other | Attending: Radiation Oncology | Admitting: Physical Therapy

## 2017-04-01 ENCOUNTER — Encounter: Payer: Self-pay | Admitting: *Deleted

## 2017-04-01 ENCOUNTER — Other Ambulatory Visit: Payer: Self-pay

## 2017-04-01 ENCOUNTER — Inpatient Hospital Stay: Payer: Medicare Other | Attending: Radiation Oncology | Admitting: Nutrition

## 2017-04-01 ENCOUNTER — Ambulatory Visit
Admission: RE | Admit: 2017-04-01 | Discharge: 2017-04-01 | Disposition: A | Discharge status: Home / Self Care | Payer: Medicare Other | Source: Ambulatory Visit | Attending: Radiation Oncology | Admitting: Radiation Oncology

## 2017-04-01 ENCOUNTER — Ambulatory Visit: Payer: Medicare Other

## 2017-04-01 VITALS — BP 142/91 | HR 73 | Temp 97.8°F | Resp 18 | Wt 196.4 lb

## 2017-04-01 DIAGNOSIS — Z9189 Other specified personal risk factors, not elsewhere classified: Secondary | ICD-10-CM

## 2017-04-01 DIAGNOSIS — C44300 Unspecified malignant neoplasm of skin of unspecified part of face: Secondary | ICD-10-CM

## 2017-04-01 DIAGNOSIS — M25612 Stiffness of left shoulder, not elsewhere classified: Secondary | ICD-10-CM | POA: Diagnosis present

## 2017-04-01 DIAGNOSIS — R293 Abnormal posture: Secondary | ICD-10-CM | POA: Insufficient documentation

## 2017-04-01 NOTE — Progress Notes (Signed)
Oncology Nurse Navigator Documentation  Met with Timothy Weaver upon his arrival for H&N MDC.  He was accompanied by his wife.  Provided verbal and written overview of MDC, the clinicians who will be seeing him, encouraged him to ask questions during his time with them.  He was seen by Nutrition, PT, SW and RadOnc Financial Administrative Support Specialist Cindy Rochelle.  Spoke with him at end of MDC, addressed questions. I encouraged him to call me with needs/concerns.  Rick Diehl, RN, BSN Head & Neck Oncology Navigator [facility] at [place] [phone]  

## 2017-04-01 NOTE — Progress Notes (Signed)
Nutrition Assessment   Reason for Assessment:   Patient seen in Head and Neck Clinic  ASSESSMENT:  77 year old male with parotid cancer.  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 Patient s/p on 1/22 resection of skin and soft tissue, total parotidectomy.  Planning radiation therapy followed by Dr. Squire.  Not planning PEG tube at this time  Met with patient and wife in clinic. Patient reports that he has good appetite.  Typically eats oatmeal or banana with coffee in the am (8-9am) then has egg with toast or meat sandwich for lunch and supper is meat and vegetables (last night had brown rice, pinto beans and beef stew.  Reports that he snacks on cheese and crackers, drinks almond milk.  Likes orgain shakes and drinks 2-3 per week.       Nutrition Focused Physical Exam: deferred  Medications: reviewed  Labs: reviewed  Anthropometrics:   Height: 72 inches Weight: 196 lb 6 oz today UBW: 192 lb, reports that 5 months ago weighed 200 lb and has intentionally lost weight BMI: 26   Estimated Energy Needs  Kcals: 2600-3000 calories/d Protein: 130-150 g/d Fluid: > 2.6 L/d  NUTRITION DIAGNOSIS: Predicted suboptimal energy intake related to cancer and cancer related treatment side effects as evidenced by radiation therapy planned.   MALNUTRITION DIAGNOSIS: continue to monitor   INTERVENTION:   Discussed importance of nutrition and weight maintenance during treatment.   Discussed importance of consuming good sources of protein at every meal.  Recommend patient liberalize diet at this time for optimal nutrition.   Encouraged patient to add protein source at breakfast and discussed ways to do that.   Encouraged continued intake of orgain shakes.      MONITORING, EVALUATION, GOAL: Patient will consume adequate calories and protein to maintain weight during treatment   NEXT VISIT: to be scheduled  Joli B. Allen, RD, LDN Registered  Dietitian [phone] (pager)       

## 2017-04-01 NOTE — Progress Notes (Signed)
[place] [facility] H&N [place]  Clinical Education officer, museum met with patient and patients wife in [facility] to offer support and assess for needs.  Patient and patient's wife did not express any needs at times time.  CSW provided education on CSW role on information on the [facility] support team and support services available.  CSW, patient, and patients wife discussed common feelings and concerns when going through treatment and the importance of support.  CSW provided contact information and encouraged patient to call with questions or concerns.    Johnnye Lana, MSW, LCSW, OSW-C Clinical Social Worker [facility] [phone]

## 2017-04-01 NOTE — Therapy (Signed)
[place], [place], [address] Phone: [phone]   Fax:  [phone]  Physical Therapy Evaluation  Patient Details  Name: Timothy Weaver MRN: [id number] Date of Birth: [birth year] Referring Provider: Dr. Eppie Gibson   Encounter Date: 04/01/2017  PT End of Session - 04/01/17 1449    Visit Number  1    Number of Visits  1    PT Start Time  0950    PT Stop Time  1020    PT Time Calculation (min)  30 min    Activity Tolerance  Patient tolerated treatment well    Behavior During Therapy  [facility] for tasks assessed/performed       Past Medical History:  Diagnosis Date  . Facial abscess 01/08/2017  . Shingles 2004  . 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 auriculectomy Left 02/25/2017   Dr. [name]- [facility].   . hearing loss    . MOUTH SURGERY    . NECK DISSECTION Left 02/25/2017   Dr. [name], [facility].   Marland Kitchen PAROTIDECTOMY Left 02/25/2017   Dr. [name] [facility]  . TONSILLECTOMY    . WISDOM TOOTH EXTRACTION      There were no vitals filed for this visit.   Subjective Assessment - 04/01/17 1432    Subjective  "I still have some numbness in the left part of my face, and it's droopy on that left side too." Reports doing wall walking exercises for left shoulder ROM since impaired function after surgery.    Patient is accompained by:  Family member wife    Pertinent History  77 year old male with left preauricular skin cancer with deep extension to the parotid.  He had resection of skin and soft tissue with total parotidectomy 02/25/17; this included neck dissection with 19-25 nodes removed.  CT chest 03/25/17 to rule out lung mets, and that was clear. He is expected to have 6 weeks of adjuvant RT to left preauricular area and left neck.    Patient Stated Goals  get info from all head & neck clinic providers     Currently in Pain?  Yes    Pain Score  1     Pain Location  Other (Comment) collarbone area    Pain Orientation  Left    Pain Descriptors / Indicators  Other (Comment) nerve pain    Pain Type  Surgical pain    Aggravating Factors   slouching back    Pain Relieving Factors  (not identified)         [facility] PT Assessment - 04/01/17 0001      Assessment   Medical Diagnosis  left preauricular skin cancer of the face with deep extension to parotid    Referring Provider  Dr. Eppie Gibson    Onset Date/Surgical Date  02/25/17 surgery with Dr. Conley Canal at [facility]    Prior Therapy  none      Precautions   Precautions  Other (comment)    Precaution Comments  cancer precautions      Restrictions   Weight Bearing Restrictions  No      Balance Screen   Has the patient fallen in the past 6 months  No    Has the patient had a decrease in activity level because of a fear of falling?   No    Is the patient reluctant to leave their home because of  a fear of falling?   No      Home Film/video editor residence    Living Arrangements  Spouse/significant other    Type of [place]  Two level      Prior Function   Level of Independence  Independent    Leisure  less exercisenow than usual, but strength training and walking, perhaps 3-5x/wk; does wall walking left shoulder ROM exercises      Cognition   Overall Cognitive Status  Within Functional Limits for tasks assessed      Observation/Other Assessments   Observations  engaged gentleman with wife in the background; he has a scar at left side of face and another at left neck; some swelling of left face is apparent, and patient reports drooping of left face that is minor      Coordination   Gross Motor Movements are Fluid and Coordinated  Yes      Functional Tests   Functional tests  Sit to Stand      Sit to Stand   Comments  17 reps in 30 seconds, excellent for his age moderate SOB following       Posture/Postural Control   Posture/Postural Control  Postural limitations    Postural Limitations  Forward head;Rounded Shoulders      ROM / Strength   AROM / PROM / Strength  AROM      AROM   Overall AROM Comments  neck AROM limited approx. 25% in extension and bilateral sidebend; rotation WFL.  Shoulder AROM on right is WFL; on left, fexion to approx. 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      Treatment   Active Radiation Treatment  -- to start soon      Lymphedema Assessments   Lymphedema Assessments  Head and Neck      Head and Neck   4 cm superior to sternal notch around neck  38.2 cm    6 cm superior to sternal notch around neck  37.7 cm    8 cm superior to sternal notch around neck  38.6 cm    Other  at 10 cm. superior, 40           Objective measurements completed on examination: See above findings.              PT Education - 04/01/17 1448    Education provided  Yes    Education Details  neck ROM, posture, walking, CURE article on staying active, "Why exercise?" flyer, PT and lymphedema info; also encouraged him to continue left shoulder wall walking exercise for ROM    Person(s) Educated  Patient;Spouse    Methods  Explanation;Handout    Comprehension  Verbalized understanding              Head and Neck Clinic Goals - 04/01/17 1455      Patient will be able to verbalize understanding of a home exercise program for cervical range of motion,  posture, and walking.    Status  Achieved      Patient will be able to verbalize understanding of proper sitting and  standing posture.    Status  Achieved      Patient will be able to verbalize understanding of lymphedema risk and availability of treatment for this condition.    Status  Achieved         Plan - 04/01/17 1449    Clinical Impression Statement  Intent, engaged gentleman with nearly healed scars from recent surgery for resection of left facial skin carcinoma with deep extension to parotid as well as from neck dissection. He has some swelling evident and he reports numbness and decreased muscle firing in left face. 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  He has forward head posture and limited neck ROM, but did very well on 30 second sit to stand test. He reports exercising currently, but less than his usual.    Clinical Presentation  Evolving    Clinical Presentation due to:  still healing from surgery and about to begin adjuvant RT    Clinical Decision Making  Low    Rehab Potential  Good    PT Frequency  One time visit    PT Treatment/Interventions  Patient/family education    PT Next Visit Plan  no follow-up planned unless lymphedema or other sequelae of treatment develop    PT Home Exercise Plan  shoulder and neck ROM, walking    Consulted and Agree with Plan of Care  Patient       Patient will benefit from skilled therapeutic intervention in order to improve the following deficits and impairments:  Decreased range of motion, Decreased scar mobility, Postural dysfunction, Other (comment)(significant risk of lymphedema)  Visit Diagnosis: Skin cancer of face - Plan: PT plan of care cert/re-cert  At risk for lymphedema - Plan: PT plan of care cert/re-cert  Abnormal posture - Plan: PT plan of care cert/re-cert  Stiffness of left shoulder, not elsewhere classified - Plan: PT plan of care cert/re-cert     Problem List Patient Active Problem List   Diagnosis Date Noted  . Cancer of skin of face 03/19/2017     SALISBURY,DONNA 04/01/2017, 2:57 PM  [place], [place], [address] Phone: [phone]   Fax:  [phone]  Name: Timothy Weaver MRN: [id number] Date of Birth: [birth year]  Serafina Royals, PT 04/01/17 2:57 PM

## 2017-04-01 NOTE — Progress Notes (Signed)
Financial Counseling--Spoke with patient and wife today--answered questions they had about insurance and billing--gave them my card to call with any additional questions

## 2017-04-02 ENCOUNTER — Ambulatory Visit
Admission: RE | Admit: 2017-04-02 | Discharge: 2017-04-02 | Disposition: A | Discharge status: Home / Self Care | Payer: Medicare Other | Source: Ambulatory Visit | Attending: Radiation Oncology | Admitting: Radiation Oncology

## 2017-04-02 DIAGNOSIS — C44300 Unspecified malignant neoplasm of skin of unspecified part of face: Secondary | ICD-10-CM

## 2017-04-02 DIAGNOSIS — Z510 Encounter for antineoplastic radiation therapy: Secondary | ICD-10-CM | POA: Diagnosis not present

## 2017-04-02 NOTE — Progress Notes (Signed)
Head and Neck Cancer Simulation, IMRT treatment planning, and Special treatment procedure note   Outpatient  Diagnosis:    ICD-10-CM   1. Cancer of skin of face C44.300    The patient was taken to the CT simulator and laid in the supine position on the table. An Aquaplast head and shoulder mask was custom fitted to the patient's anatomy. High-resolution CT axial imaging was obtained of the head and neck with contrast. I verified that the quality of the imaging is good for treatment planning. 1 Medically Necessary Treatment Device was fabricated and supervised by me: Aquaplast mask.  Treatment planning note I plan to treat the patient with IMRT. I plan to treat the patient's tumor bed to the skull base and ipsilateral neck nodes. I plan to treat to a total dose of 60 Gray in 30  fractions. Dose calculation was ordered from dosimetry.  IMRT planning Note  IMRT is medically necessary and an important modality to deliver adequate dose to the patient's at risk tissues while sparing the patient's normal structures, including the: esophagus, parotid tissue, mandible, brain stem, spinal cord, oral cavity, brachial plexus.  This justifies the use of IMRT in the patient's treatment.    -----------------------------------  Eppie Gibson, MD

## 2017-04-04 ENCOUNTER — Encounter: Payer: Self-pay | Admitting: Radiation Oncology

## 2017-04-04 NOTE — Progress Notes (Signed)
Oncology Nurse Navigator Documentation  To provide support, encouragement and care continuity, met with Mr. Heffner during CT [facility].  He was accompanied by his wife. He tolerated procedure with open-face mask without incident. Showed them LINAC 2 tmt area, explained lobby registration, arrival to and preparation for tmt procedures.  He voiced understanding. I encouraged him to cal with questions prior to 3/7 New Start.  Gayleen Orem, RN, BSN Head & Neck Oncology Nurse [place] at [facility] [phone]

## 2017-04-09 DIAGNOSIS — Z510 Encounter for antineoplastic radiation therapy: Secondary | ICD-10-CM | POA: Insufficient documentation

## 2017-04-09 DIAGNOSIS — C44300 Unspecified malignant neoplasm of skin of unspecified part of face: Secondary | ICD-10-CM | POA: Insufficient documentation

## 2017-04-10 ENCOUNTER — Ambulatory Visit
Admission: RE | Admit: 2017-04-10 | Discharge: 2017-04-10 | Disposition: A | Discharge status: Home / Self Care | Payer: Medicare Other | Source: Ambulatory Visit | Attending: Radiation Oncology | Admitting: Radiation Oncology

## 2017-04-10 DIAGNOSIS — C44300 Unspecified malignant neoplasm of skin of unspecified part of face: Secondary | ICD-10-CM | POA: Diagnosis not present

## 2017-04-11 ENCOUNTER — Ambulatory Visit
Admission: RE | Admit: 2017-04-11 | Discharge: 2017-04-11 | Disposition: A | Discharge status: Home / Self Care | Payer: Medicare Other | Source: Ambulatory Visit | Attending: Radiation Oncology | Admitting: Radiation Oncology

## 2017-04-11 DIAGNOSIS — C44300 Unspecified malignant neoplasm of skin of unspecified part of face: Secondary | ICD-10-CM | POA: Diagnosis not present

## 2017-04-14 ENCOUNTER — Ambulatory Visit
Admission: RE | Admit: 2017-04-14 | Discharge: 2017-04-14 | Disposition: A | Discharge status: Home / Self Care | Payer: Medicare Other | Source: Ambulatory Visit | Attending: Radiation Oncology | Admitting: Radiation Oncology

## 2017-04-14 DIAGNOSIS — C44300 Unspecified malignant neoplasm of skin of unspecified part of face: Secondary | ICD-10-CM

## 2017-04-14 MED ORDER — SONAFINE EX EMUL
1.0000 "application " | Freq: Two times a day (BID) | CUTANEOUS | Status: DC
  Administered 2017-04-14: 1 via TOPICAL

## 2017-04-14 NOTE — Progress Notes (Signed)
Pt here for patient teaching.  Pt given Radiation and You booklet, skin care instructions and Sonafine.  Reviewed areas of pertinence such as fatigue, hair loss, mouth changes, skin changes, throat changes and earaches . Pt able to give teach back of to pat skin and use unscented/gentle soap,apply Sonafine bid and avoid applying anything to skin within 4 hours of treatment. Pt demonstrated understanding, needs reinforcement, no evidence of learning, refused teaching and  of information given and will contact nursing with any questions or concerns.     Http://rtanswers.org/treatmentinformation/whattoexpect/index

## 2017-04-15 ENCOUNTER — Ambulatory Visit
Admission: RE | Admit: 2017-04-15 | Discharge: 2017-04-15 | Disposition: A | Discharge status: Home / Self Care | Payer: Medicare Other | Source: Ambulatory Visit | Attending: Radiation Oncology | Admitting: Radiation Oncology

## 2017-04-15 DIAGNOSIS — C44300 Unspecified malignant neoplasm of skin of unspecified part of face: Secondary | ICD-10-CM | POA: Diagnosis not present

## 2017-04-16 ENCOUNTER — Ambulatory Visit
Admission: RE | Admit: 2017-04-16 | Discharge: 2017-04-16 | Disposition: A | Discharge status: Home / Self Care | Payer: Medicare Other | Source: Ambulatory Visit | Attending: Radiation Oncology | Admitting: Radiation Oncology

## 2017-04-16 DIAGNOSIS — C44300 Unspecified malignant neoplasm of skin of unspecified part of face: Secondary | ICD-10-CM | POA: Diagnosis not present

## 2017-04-17 ENCOUNTER — Ambulatory Visit
Admission: RE | Admit: 2017-04-17 | Discharge: 2017-04-17 | Disposition: A | Discharge status: Home / Self Care | Payer: Medicare Other | Source: Ambulatory Visit | Attending: Radiation Oncology | Admitting: Radiation Oncology

## 2017-04-17 DIAGNOSIS — C44300 Unspecified malignant neoplasm of skin of unspecified part of face: Secondary | ICD-10-CM | POA: Diagnosis not present

## 2017-04-18 ENCOUNTER — Ambulatory Visit
Admission: RE | Admit: 2017-04-18 | Discharge: 2017-04-18 | Disposition: A | Discharge status: Home / Self Care | Payer: Medicare Other | Source: Ambulatory Visit | Attending: Radiation Oncology | Admitting: Radiation Oncology

## 2017-04-18 DIAGNOSIS — C44300 Unspecified malignant neoplasm of skin of unspecified part of face: Secondary | ICD-10-CM | POA: Diagnosis not present

## 2017-04-21 ENCOUNTER — Other Ambulatory Visit (HOSPITAL_COMMUNITY): Payer: Self-pay | Admitting: Dentistry

## 2017-04-21 ENCOUNTER — Ambulatory Visit
Admission: RE | Admit: 2017-04-21 | Discharge: 2017-04-21 | Disposition: A | Discharge status: Home / Self Care | Payer: Medicare Other | Source: Ambulatory Visit | Attending: Radiation Oncology | Admitting: Radiation Oncology

## 2017-04-21 DIAGNOSIS — C44300 Unspecified malignant neoplasm of skin of unspecified part of face: Secondary | ICD-10-CM | POA: Diagnosis not present

## 2017-04-22 ENCOUNTER — Encounter (HOSPITAL_COMMUNITY): Payer: Self-pay | Admitting: Dentistry

## 2017-04-22 ENCOUNTER — Inpatient Hospital Stay: Payer: Medicare Other | Attending: Radiation Oncology | Admitting: Nutrition

## 2017-04-22 ENCOUNTER — Ambulatory Visit (HOSPITAL_COMMUNITY): Payer: Medicaid - Dental | Admitting: Dentistry

## 2017-04-22 ENCOUNTER — Ambulatory Visit
Admission: RE | Admit: 2017-04-22 | Discharge: 2017-04-22 | Disposition: A | Discharge status: Home / Self Care | Payer: Medicare Other | Source: Ambulatory Visit | Attending: Radiation Oncology | Admitting: Radiation Oncology

## 2017-04-22 VITALS — BP 120/74 | HR 67 | Temp 98.0°F | Wt 194.0 lb

## 2017-04-22 DIAGNOSIS — K117 Disturbances of salivary secretion: Secondary | ICD-10-CM

## 2017-04-22 DIAGNOSIS — R682 Dry mouth, unspecified: Secondary | ICD-10-CM

## 2017-04-22 DIAGNOSIS — C44300 Unspecified malignant neoplasm of skin of unspecified part of face: Secondary | ICD-10-CM | POA: Diagnosis not present

## 2017-04-22 NOTE — Patient Instructions (Signed)
RECOMMENDATIONS: 1. Brush after meals and at bedtime.  Use fluoride at bedtime. 2. Use trismus exercises as directed. 3. Use Biotene Rinse or salt water/baking soda rinses. 4. Multiple sips of water as needed. 5. Return to clinic in two months for oral exam after radiation therapy. Call if problems before then.  Ronald F. Kulinski, DDS   

## 2017-04-22 NOTE — Progress Notes (Signed)
04/22/2017  Patient Name:   Timothy Weaver Date of Birth:   [birth year] Medical Record Number: [id number]  BP 120/74 (BP Location: Right Arm)   Pulse 67   Temp 98 F (36.7 C)   Wt 194 lb (88 kg)   BMI 26.31 kg/m   Valma Cava presents for oral examination during radiation therapy. Patient has completed 8/30 radiation treatments. No chemotherapy  REVIEW OF CHIEF COMPLAINTS:  DRY MOUTH: Yes HARD TO SWALLOW: no  HURT TO SWALLOW: no TASTE CHANGES: denies taste changes SORES IN MOUTH: denies sores in his mouth TRISMUS: no problems with trismus WEIGHT: 194 pounds and stable  HOME OH REGIMEN:  BRUSHING: 3 Times a day FLOSSING:  Once a day RINSING: using Act rinses. FLUORIDE: using fluoride in his trays at bedtime TRISMUS EXERCISES:  Maximum interincisal opening: 45 mm unchanged from initial.   DENTAL EXAM:  Oral Hygiene:(PLAQUE): good oral hygiene LOCATION OF MUCOSITIS: none noted DESCRIPTION OF SALIVA: Decreased saliva.Foamy saliva. ANY EXPOSED BONE: None noted OTHER WATCHED AREAS: Tooth #30 with persistent periapical radiolucency after Root canal therapy.follow-up with endodontist for reevaluation 2-3 months after last radiation therapy is then provided.  DX: Xerostomia  RECOMMENDATIONS: 1. Brush after meals and at bedtime.  Use fluoride at bedtime. 2. Use trismus exercises as directed. 3. Use Biotene Rinse or salt water/baking soda rinses. 4. Multiple sips of water as needed. 5. Return to clinic in two months for oral exam after radiation therapy. Call if problems before then.  Lenn Cal, DDS

## 2017-04-23 ENCOUNTER — Ambulatory Visit
Admission: RE | Admit: 2017-04-23 | Discharge: 2017-04-23 | Disposition: A | Discharge status: Home / Self Care | Payer: Medicare Other | Source: Ambulatory Visit | Attending: Radiation Oncology | Admitting: Radiation Oncology

## 2017-04-23 DIAGNOSIS — C44300 Unspecified malignant neoplasm of skin of unspecified part of face: Secondary | ICD-10-CM | POA: Diagnosis not present

## 2017-04-24 ENCOUNTER — Ambulatory Visit
Admission: RE | Admit: 2017-04-24 | Discharge: 2017-04-24 | Disposition: A | Discharge status: Home / Self Care | Payer: Medicare Other | Source: Ambulatory Visit | Attending: Radiation Oncology | Admitting: Radiation Oncology

## 2017-04-24 DIAGNOSIS — C44300 Unspecified malignant neoplasm of skin of unspecified part of face: Secondary | ICD-10-CM | POA: Diagnosis not present

## 2017-04-25 ENCOUNTER — Ambulatory Visit
Admission: RE | Admit: 2017-04-25 | Discharge: 2017-04-25 | Disposition: A | Discharge status: Home / Self Care | Payer: Medicare Other | Source: Ambulatory Visit | Attending: Radiation Oncology | Admitting: Radiation Oncology

## 2017-04-25 DIAGNOSIS — C44300 Unspecified malignant neoplasm of skin of unspecified part of face: Secondary | ICD-10-CM | POA: Diagnosis not present

## 2017-04-28 ENCOUNTER — Other Ambulatory Visit: Payer: Self-pay | Admitting: Radiation Oncology

## 2017-04-28 ENCOUNTER — Encounter: Payer: Self-pay | Admitting: Nutrition

## 2017-04-28 ENCOUNTER — Inpatient Hospital Stay: Payer: Self-pay | Admitting: Nutrition

## 2017-04-28 ENCOUNTER — Ambulatory Visit
Admission: RE | Admit: 2017-04-28 | Discharge: 2017-04-28 | Disposition: A | Discharge status: Home / Self Care | Payer: Medicare Other | Source: Ambulatory Visit | Attending: Radiation Oncology | Admitting: Radiation Oncology

## 2017-04-28 DIAGNOSIS — C44300 Unspecified malignant neoplasm of skin of unspecified part of face: Secondary | ICD-10-CM | POA: Diagnosis not present

## 2017-04-28 MED ORDER — LIDOCAINE VISCOUS 2 % MT SOLN: OROMUCOSAL | 5 refills | Status: DC

## 2017-04-28 MED FILL — LIDOCAINE 2% VISCOUS SOLN: 2 | 5 days supply | Qty: 100 | Fill #0

## 2017-04-28 NOTE — Progress Notes (Signed)
Patient did not show for nutrition appointments on 04/22/17 or 04/28/17.

## 2017-04-29 ENCOUNTER — Ambulatory Visit
Admission: RE | Admit: 2017-04-29 | Discharge: 2017-04-29 | Disposition: A | Discharge status: Home / Self Care | Payer: Medicare Other | Source: Ambulatory Visit | Attending: Radiation Oncology | Admitting: Radiation Oncology

## 2017-04-29 DIAGNOSIS — C44300 Unspecified malignant neoplasm of skin of unspecified part of face: Secondary | ICD-10-CM | POA: Diagnosis not present

## 2017-04-30 ENCOUNTER — Encounter: Payer: Self-pay | Admitting: *Deleted

## 2017-04-30 ENCOUNTER — Ambulatory Visit
Admission: RE | Admit: 2017-04-30 | Discharge: 2017-04-30 | Disposition: A | Discharge status: Home / Self Care | Payer: Medicare Other | Source: Ambulatory Visit | Attending: Radiation Oncology | Admitting: Radiation Oncology

## 2017-04-30 DIAGNOSIS — C44300 Unspecified malignant neoplasm of skin of unspecified part of face: Secondary | ICD-10-CM | POA: Diagnosis not present

## 2017-05-01 ENCOUNTER — Ambulatory Visit
Admission: RE | Admit: 2017-05-01 | Discharge: 2017-05-01 | Disposition: A | Discharge status: Home / Self Care | Payer: Medicare Other | Source: Ambulatory Visit | Attending: Radiation Oncology | Admitting: Radiation Oncology

## 2017-05-01 DIAGNOSIS — C44300 Unspecified malignant neoplasm of skin of unspecified part of face: Secondary | ICD-10-CM | POA: Diagnosis not present

## 2017-05-02 ENCOUNTER — Ambulatory Visit
Admission: RE | Admit: 2017-05-02 | Discharge: 2017-05-02 | Disposition: A | Discharge status: Home / Self Care | Payer: Medicare Other | Source: Ambulatory Visit | Attending: Radiation Oncology | Admitting: Radiation Oncology

## 2017-05-02 DIAGNOSIS — C44300 Unspecified malignant neoplasm of skin of unspecified part of face: Secondary | ICD-10-CM | POA: Diagnosis not present

## 2017-05-05 ENCOUNTER — Ambulatory Visit
Admission: RE | Admit: 2017-05-05 | Discharge: 2017-05-05 | Disposition: A | Discharge status: Home / Self Care | Payer: Medicare Other | Source: Ambulatory Visit | Attending: Radiation Oncology | Admitting: Radiation Oncology

## 2017-05-05 DIAGNOSIS — C44300 Unspecified malignant neoplasm of skin of unspecified part of face: Secondary | ICD-10-CM | POA: Insufficient documentation

## 2017-05-05 DIAGNOSIS — Z510 Encounter for antineoplastic radiation therapy: Secondary | ICD-10-CM | POA: Insufficient documentation

## 2017-05-05 MED ORDER — SONAFINE EX EMUL
1.0000 "application " | Freq: Once | CUTANEOUS | Status: AC
  Administered 2017-05-05: 1 via TOPICAL

## 2017-05-06 ENCOUNTER — Inpatient Hospital Stay: Payer: Medicare Other | Attending: Radiation Oncology | Admitting: Nutrition

## 2017-05-06 ENCOUNTER — Ambulatory Visit
Admission: RE | Admit: 2017-05-06 | Discharge: 2017-05-06 | Disposition: A | Discharge status: Home / Self Care | Payer: Medicare Other | Source: Ambulatory Visit | Attending: Radiation Oncology | Admitting: Radiation Oncology

## 2017-05-06 DIAGNOSIS — C44300 Unspecified malignant neoplasm of skin of unspecified part of face: Secondary | ICD-10-CM | POA: Diagnosis not present

## 2017-05-06 NOTE — Progress Notes (Signed)
Nutrition follow-up completed with patient receiving radiation therapy for parotid cancer. Weight document 193.8 pounds April 2 stable overall from 194 pounds March 19. Patient reports he is tolerating soft foods such as spaghetti and meatballs, pinto beans, eggs, meat loaf and mashed potatoes. He has been consuming 1 orgain nutrition supplement daily. Patient prefers to drink almond milk Patient reports his nausea is controlled. He has dry mouth.  He understands he needs to drink more fluids.  Nutrition diagnosis: Predicted suboptimal energy intake continues.  Intervention: I educated patient to continue strategies for high-calorie high-protein meals and snacks in small amounts throughout the day. Recommended increase orgain nutrition shakes twice daily.  Suggested he may need to switch to a higher calorie shake if weight loss occurs. Educated patient on strategies for relieving dry mouth. Provided fact sheets and contact information.  Questions were answered and teach back method used.  Monitoring evaluation goals: Patient will tolerate increased calories and protein for weight maintenance.  Next visit: Wednesday, April 10 after radiation therapy.  **Disclaimer: This note was dictated with voice recognition software. Similar sounding words can inadvertently be transcribed and this note may contain transcription errors which may not have been corrected upon publication of note.**

## 2017-05-07 ENCOUNTER — Ambulatory Visit
Admission: RE | Admit: 2017-05-07 | Discharge: 2017-05-07 | Disposition: A | Discharge status: Home / Self Care | Payer: Medicare Other | Source: Ambulatory Visit | Attending: Radiation Oncology | Admitting: Radiation Oncology

## 2017-05-07 DIAGNOSIS — C44300 Unspecified malignant neoplasm of skin of unspecified part of face: Secondary | ICD-10-CM | POA: Diagnosis not present

## 2017-05-08 ENCOUNTER — Ambulatory Visit
Admission: RE | Admit: 2017-05-08 | Discharge: 2017-05-08 | Disposition: A | Discharge status: Home / Self Care | Payer: Medicare Other | Source: Ambulatory Visit | Attending: Radiation Oncology | Admitting: Radiation Oncology

## 2017-05-08 DIAGNOSIS — C44300 Unspecified malignant neoplasm of skin of unspecified part of face: Secondary | ICD-10-CM | POA: Diagnosis not present

## 2017-05-09 ENCOUNTER — Ambulatory Visit
Admission: RE | Admit: 2017-05-09 | Discharge: 2017-05-09 | Disposition: A | Discharge status: Home / Self Care | Payer: Medicare Other | Source: Ambulatory Visit | Attending: Radiation Oncology | Admitting: Radiation Oncology

## 2017-05-09 DIAGNOSIS — C44300 Unspecified malignant neoplasm of skin of unspecified part of face: Secondary | ICD-10-CM | POA: Diagnosis not present

## 2017-05-12 ENCOUNTER — Ambulatory Visit
Admission: RE | Admit: 2017-05-12 | Discharge: 2017-05-12 | Disposition: A | Discharge status: Home / Self Care | Payer: Medicare Other | Source: Ambulatory Visit | Attending: Radiation Oncology | Admitting: Radiation Oncology

## 2017-05-12 DIAGNOSIS — C44300 Unspecified malignant neoplasm of skin of unspecified part of face: Secondary | ICD-10-CM | POA: Diagnosis not present

## 2017-05-13 ENCOUNTER — Ambulatory Visit
Admission: RE | Admit: 2017-05-13 | Discharge: 2017-05-13 | Disposition: A | Discharge status: Home / Self Care | Payer: Medicare Other | Source: Ambulatory Visit | Attending: Radiation Oncology | Admitting: Radiation Oncology

## 2017-05-13 DIAGNOSIS — C44300 Unspecified malignant neoplasm of skin of unspecified part of face: Secondary | ICD-10-CM | POA: Diagnosis not present

## 2017-05-14 ENCOUNTER — Inpatient Hospital Stay: Payer: Self-pay | Admitting: Nutrition

## 2017-05-14 ENCOUNTER — Ambulatory Visit
Admission: RE | Admit: 2017-05-14 | Discharge: 2017-05-14 | Disposition: A | Discharge status: Home / Self Care | Payer: Medicare Other | Source: Ambulatory Visit | Attending: Radiation Oncology | Admitting: Radiation Oncology

## 2017-05-14 ENCOUNTER — Encounter: Payer: Self-pay | Admitting: Nutrition

## 2017-05-14 DIAGNOSIS — C44300 Unspecified malignant neoplasm of skin of unspecified part of face: Secondary | ICD-10-CM | POA: Diagnosis not present

## 2017-05-14 NOTE — Progress Notes (Signed)
Patient did not show up for nutrition appointment. 

## 2017-05-15 ENCOUNTER — Ambulatory Visit
Admission: RE | Admit: 2017-05-15 | Discharge: 2017-05-15 | Disposition: A | Discharge status: Home / Self Care | Payer: Medicare Other | Source: Ambulatory Visit | Attending: Radiation Oncology | Admitting: Radiation Oncology

## 2017-05-15 DIAGNOSIS — C44300 Unspecified malignant neoplasm of skin of unspecified part of face: Secondary | ICD-10-CM | POA: Diagnosis not present

## 2017-05-16 ENCOUNTER — Ambulatory Visit
Admission: RE | Admit: 2017-05-16 | Discharge: 2017-05-16 | Disposition: A | Discharge status: Home / Self Care | Payer: Medicare Other | Source: Ambulatory Visit | Attending: Radiation Oncology | Admitting: Radiation Oncology

## 2017-05-16 DIAGNOSIS — C44300 Unspecified malignant neoplasm of skin of unspecified part of face: Secondary | ICD-10-CM | POA: Diagnosis not present

## 2017-05-19 ENCOUNTER — Ambulatory Visit
Admission: RE | Admit: 2017-05-19 | Discharge: 2017-05-19 | Disposition: A | Discharge status: Home / Self Care | Payer: Medicare Other | Source: Ambulatory Visit | Attending: Radiation Oncology | Admitting: Radiation Oncology

## 2017-05-19 DIAGNOSIS — C44300 Unspecified malignant neoplasm of skin of unspecified part of face: Secondary | ICD-10-CM | POA: Diagnosis not present

## 2017-05-20 ENCOUNTER — Ambulatory Visit
Admission: RE | Admit: 2017-05-20 | Discharge: 2017-05-20 | Disposition: A | Discharge status: Home / Self Care | Payer: Medicare Other | Source: Ambulatory Visit | Attending: Radiation Oncology | Admitting: Radiation Oncology

## 2017-05-20 DIAGNOSIS — C44300 Unspecified malignant neoplasm of skin of unspecified part of face: Secondary | ICD-10-CM

## 2017-05-20 MED ORDER — SONAFINE EX EMUL
1.0000 "application " | Freq: Once | CUTANEOUS | Status: AC
  Administered 2017-05-20: 1 via TOPICAL

## 2017-05-21 ENCOUNTER — Inpatient Hospital Stay: Payer: Medicare Other | Admitting: Nutrition

## 2017-05-21 ENCOUNTER — Ambulatory Visit
Admission: RE | Admit: 2017-05-21 | Discharge: 2017-05-21 | Disposition: A | Discharge status: Home / Self Care | Payer: Medicare Other | Source: Ambulatory Visit | Attending: Radiation Oncology | Admitting: Radiation Oncology

## 2017-05-21 ENCOUNTER — Encounter: Payer: Self-pay | Admitting: *Deleted

## 2017-05-21 DIAGNOSIS — C44300 Unspecified malignant neoplasm of skin of unspecified part of face: Secondary | ICD-10-CM | POA: Diagnosis not present

## 2017-05-21 NOTE — Progress Notes (Signed)
Nutrition follow-up completed with patient.  This is his final day of radiation therapy for parotid cancer. Weight decreased and documented as 189.6 pounds down from 193.8 pounds April 2. Patient reports very poor appetite along with altered taste.   He is drinking 1 orgain nutrition supplement daily. He continues to force himself to eat soft foods. Patient verbalizes understanding how important nutrition is in his healing process. Patient has large area of side of face, ear and shoulder area affected by radiation therapy.  Nutrition diagnosis:  Predicted suboptimal energy intake has evolved into inadequate oral intake related to parotid cancer and associated treatments as evidenced by 7 pound weight loss since February 26.  Intervention: Educated patient on increasing calories and protein, especially over the next month to 6 weeks. Encouraged high-protein foods and suggested alternatives for improving taste Encourage patient to try to increase Orgain or other oral nutrition supplement back to 2 daily. Educated patient to begin multivitamin and 500 mg vitamin C twice daily. Also recommended 220 mg of zinc sulfate for 2 weeks to improve healing. Questions were answered.  Teach back method used.  Contact information has been provided.  Monitoring, evaluation, goals: Patient will tolerate increased protein and calories to promote healing and minimize weight loss.  Next visit: To be scheduled as needed.  **Disclaimer: This note was dictated with voice recognition software. Similar sounding words can inadvertently be transcribed and this note may contain transcription errors which may not have been corrected upon publication of note.**

## 2017-05-21 NOTE — Progress Notes (Signed)
Oncology Nurse Navigator Documentation  Met with pt during final RT to offer support and to celebrate end of radiation treatment.  He was accompanied by his wife. I provided verbal/written post-RT guidance:  Importance of keeping all follow-up appts, especially those with Nutrition and dr. Isidore Moos.SLP.  Importance of protecting treatment area from sun.  Continuation of Sonafine application 2-3 times daily until supply exhausted after which transition to OTC lotion with vitamin E. I explained that my role as navigator will continue for several more months and that I will be calling and/or joining him during follow-up visits.   I encouraged him to call me with needs/concerns.   Patient and wife verbalized understanding of information provided.  Gayleen Orem, RN, BSN, [place] at [place] [phone]

## 2017-06-03 ENCOUNTER — Encounter: Payer: Self-pay | Admitting: Radiation Oncology

## 2017-06-03 NOTE — Progress Notes (Signed)
  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   Patient endorses diminished appetite and taste changes during treatment but denied pain throughout.  Plan: The patient has completed radiation treatment. The patient will return to radiation oncology clinic for routine followup in one half month. I advised them to call or return sooner if they have any questions or concerns related to their recovery or treatment.  -----------------------------------  Eppie Gibson, MD  This document serves as a record of services personally performed by Eppie Gibson, MD. It was created on his behalf by Linward Natal, a trained medical scribe. The creation of this record is based on the scribe's personal observations and the provider's statements to them. This document has been checked and approved by the attending provider.

## 2017-06-03 NOTE — Progress Notes (Signed)
Timothy Weaver presents for follow up of radiation completed 05/21/17 to his Left Parotid.   Pain issues, if any: He has some minor throat pain.  Using a feeding tube?: No Weight changes, if any:  Wt Readings from Last 3 Encounters:  06/06/17 189 lb 12.8 oz (86.1 kg)  05/21/17 189 lb 9.6 oz (86 kg)  05/06/17 193 lb 12.8 oz (87.9 kg)   Swallowing issues, if any: He reports improvement in throat pain, and tells me that he is swallowing mostly softer foods. It does take him longer to chew and swallow.  Smoking or chewing tobacco? No Using fluoride trays daily? Yes, about 3-4 times weekly.  Last ENT visit was on: Not since diagnosis.  Other notable issues, if any:  06/23/17 Dr. [name]  He reports low grade fever, sneezing, congestion over the past week.  His skin has healed well. He continues to have redness to his radiation site. He is not using sonafine at this time. He was instructed to begin using vitamin E oil or lotion.   BP 127/80 (BP Location: Right Arm, Patient Position: Sitting, Cuff Size: Normal)   Pulse 66   Temp 98.3 F (36.8 C) (Oral)   Resp 20   Wt 189 lb 12.8 oz (86.1 kg)   SpO2 100%   BMI 25.74 kg/m

## 2017-06-06 ENCOUNTER — Encounter: Payer: Self-pay | Admitting: Radiation Oncology

## 2017-06-06 ENCOUNTER — Other Ambulatory Visit: Payer: Self-pay

## 2017-06-06 ENCOUNTER — Ambulatory Visit
Admission: RE | Admit: 2017-06-06 | Discharge: 2017-06-06 | Disposition: A | Discharge status: Home / Self Care | Payer: Medicare Other | Source: Ambulatory Visit | Attending: Radiation Oncology | Admitting: Radiation Oncology

## 2017-06-06 VITALS — BP 127/80 | HR 66 | Temp 98.3°F | Resp 20 | Wt 189.8 lb

## 2017-06-06 DIAGNOSIS — Z79899 Other long term (current) drug therapy: Secondary | ICD-10-CM | POA: Diagnosis not present

## 2017-06-06 DIAGNOSIS — R070 Pain in throat: Secondary | ICD-10-CM | POA: Insufficient documentation

## 2017-06-06 DIAGNOSIS — C44300 Unspecified malignant neoplasm of skin of unspecified part of face: Secondary | ICD-10-CM | POA: Diagnosis present

## 2017-06-06 DIAGNOSIS — Z510 Encounter for antineoplastic radiation therapy: Secondary | ICD-10-CM | POA: Insufficient documentation

## 2017-06-06 HISTORY — DX: Personal history of irradiation: Z92.3

## 2017-06-06 NOTE — Progress Notes (Signed)
Radiation Oncology         [phone] ________________________________  Name: Timothy Weaver MRN: [id number]  Date: 06/06/2017  DOB: 1940-10-23  Follow-Up Visit Note  CC: Patient, No Pcp Per  [name], *  Diagnosis and Prior Radiotherapy:       ICD-10-CM   1. 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  Narrative:  The patient returns today for routine follow-up of radiation of radiation completed 2 weeks ago to his left parotid.   Pain issues, if any: He reports some minor throat pain.   Using a feeding tube?: No  Weight changes, if any:  Wt Readings from Last 3 Encounters:  06/06/17 189 lb 12.8 oz (86.1 kg)  05/21/17 189 lb 9.6 oz (86 kg)  05/06/17 193 lb 12.8 oz (87.9 kg)   Swallowing issues, if any: He reports improvement in throat pain and states that he is swallowing mostly softer foods, but it does take him longer to chew and swallow.   Smoking or chewing tobacco? No  Using fluoride trays daily? Yes, about 3-4 times weekly.   Last ENT visit was on: Not since diagnosis.   Other notable issues, if any: He reports low grade fever, sneezing, and congestion over the past week and took Tylenol. He continues to have bilateral hearing impairment and tingling which is stable since operation.                     ALLERGIES:  has No Known Allergies.  Meds: Current Outpatient Medications  Medication Sig Dispense Refill  . acetaminophen  (TYLENOL) 500 MG tablet Take 500 mg by mouth every 6 (six) hours as needed.    Marland Kitchen Cod Liver Oil 1000 MG CAPS Take by mouth.    Marland Kitchen ibuprofen (ADVIL,MOTRIN) 400 MG tablet Take 400 mg by mouth every 6 (six) hours as needed.    Marland Kitchen LYSINE PO Take by mouth.    . Multiple Vitamins-Minerals (MULTIVITAMIN WITH MINERALS) tablet Take 1 tablet by mouth daily.    . sodium fluoride (FLUORISHIELD) 1.1 % GEL dental gel Instill one drop of gel per tooth space of fluoride tray. Place over teeth for 5 minutes. Remove. Spit out excess. Repeat nightly. 120 mL 11  . vitamin C (ASCORBIC ACID) 500 MG tablet Take 500 mg by mouth daily.    . vitamin E 200 UNIT capsule Take 200 Units by mouth daily.    Marland Kitchen lidocaine (XYLOCAINE) 2 % solution Patient: Mix 1part 2% viscous lidocaine, 1part H20. Swish & swallow 79mL of diluted mixture, 71min before meals and at bedtime, up to QID (Patient not taking: Reported on 06/06/2017) 100 mL 5   No current facility-administered medications for this encounter.     Physical Findings: Wt Readings from Last 3 Encounters:  06/06/17 189 lb 12.8 oz (86.1 kg)  05/21/17 189 lb 9.6 oz (86 kg)  05/06/17 193 lb 12.8 oz (87.9 kg)    weight  is 189 lb 12.8 oz (86.1 kg). His oral temperature is 98.3 F (36.8 C). His blood pressure is 127/80 and his pulse is 66. His respiration is 20 and oxygen saturation is 100%. .  General: Alert and oriented, in no acute distress. HEENT: Head is normocephalic. He has some cerumen in the left ear canal. Oral cavity and oropharynx are clear. Neck: No residual lymphedema. No palpable masses appreciated in the neck. Skin: Skin in treatment fields shows satisfactory healing. There is residual dryness and erythema over left face and left neck.   Lab Findings: Lab Results  Component Value Date   WBC 6.6 03/25/2017   HGB 14.8 03/25/2017   HCT 45.2 03/25/2017   MCV 90.2 03/25/2017   PLT 270 03/25/2017    Lab Results  Component Value Date   TSH 1.091 03/25/2017     Radiographic Findings: No results found.  Impression/Plan:    1) Head and Neck Cancer Status: Healing well from radiotherapy. He continues to have dryness and erythema to the radiation site. He was instructed to begin using vitamin E oil or lotion. Will obtain CT of  neck, chest in 2.5 months.   2) Nutritional Status: Weight stable.  4) Swallowing: Improvement in throat pain.   5) Dental: Encouraged to continue regular followup with dentistry, and dental hygiene including fluoride rinses. He is scheduled to see Dr. Enrique Sack on 06/23/2017.  6) Thyroid function: unlikely to be affected by ipsilateral RT, but consider screening annually. Lab Results  Component Value Date   TSH 1.091 03/25/2017    7) Other: There is some cerumen in the left ear that is partially blocking the ear canal and might be contributing to the patient's hearing loss. We discussed that this may be removed and if his hearing does not improve he will need to see Dr. Janace Hoard for a hearing test.   8) Follow-up in 2.5 months, a day after CT. The patient was encouraged to call with any issues or questions before then.   _____________________________________   Eppie Gibson, MD  This document serves as a record of services personally performed by Eppie Gibson, MD. It was created on her behalf by Rae Lips, a trained medical scribe. The creation of this record is based on the scribe's personal observations and the provider's statements to them. This document has been checked and approved by the attending provider.

## 2017-06-08 ENCOUNTER — Encounter: Payer: Self-pay | Admitting: Radiation Oncology

## 2017-06-08 ENCOUNTER — Other Ambulatory Visit: Payer: Self-pay | Admitting: Radiation Oncology

## 2017-06-08 DIAGNOSIS — C44300 Unspecified malignant neoplasm of skin of unspecified part of face: Secondary | ICD-10-CM

## 2017-06-13 DIAGNOSIS — H40013 Open angle with borderline findings, low risk, bilateral: Secondary | ICD-10-CM | POA: Diagnosis not present

## 2017-06-13 DIAGNOSIS — H40053 Ocular hypertension, bilateral: Secondary | ICD-10-CM | POA: Diagnosis not present

## 2017-06-23 ENCOUNTER — Ambulatory Visit (HOSPITAL_COMMUNITY): Payer: Medicaid - Dental | Admitting: Dentistry

## 2017-06-23 ENCOUNTER — Encounter (HOSPITAL_COMMUNITY): Payer: Self-pay | Admitting: Dentistry

## 2017-06-23 VITALS — BP 114/67 | HR 67 | Temp 98.6°F | Wt 183.0 lb

## 2017-06-23 DIAGNOSIS — C44300 Unspecified malignant neoplasm of skin of unspecified part of face: Secondary | ICD-10-CM

## 2017-06-23 DIAGNOSIS — R682 Dry mouth, unspecified: Secondary | ICD-10-CM

## 2017-06-23 DIAGNOSIS — Z923 Personal history of irradiation: Secondary | ICD-10-CM

## 2017-06-23 DIAGNOSIS — K117 Disturbances of salivary secretion: Secondary | ICD-10-CM

## 2017-06-23 DIAGNOSIS — Z0120 Encounter for dental examination and cleaning without abnormal findings: Secondary | ICD-10-CM | POA: Diagnosis not present

## 2017-06-23 DIAGNOSIS — K031 Abrasion of teeth: Secondary | ICD-10-CM

## 2017-06-23 DIAGNOSIS — R432 Parageusia: Secondary | ICD-10-CM

## 2017-06-23 NOTE — Progress Notes (Signed)
06/23/2017  Patient Name:   Timothy Weaver Date of Birth:   May 31, 1940 Medical Record Number: [id number]  BP 114/67 (BP Location: Left Arm)   Pulse 67   Temp 98.6 F (37 C)   Wt 183 lb (83 kg)   BMI 24.82 kg/m   Valma Cava presents for oral examination after radiation therapy. Patient has completed all radiation treatments from 04/10/2017 through  05/21/2017. No Chemotherapy.  REVIEW OF CHIEF COMPLAINTS: DRY MOUTH: yes HARD TO SWALLOW: no  HURT TO SWALLOW: no TASTE CHANGES: patient is returning slowly SORES IN MOUTH: denies TRISMUS: patient with minimal trismus symptoms. WEIGHT: patient weighs 183 pounds down from the original 194 pounds.  HOME OH REGIMEN:  BRUSHING: twice a day FLOSSING: once a day RINSING:Patient occasionally uses salt water and baking soda rinses as needed or Act rinses. FLUORIDE: Uses trays at night "most of time". TRISMUS EXERCISES:  Maximum interincisal opening: 45 mm   DENTAL EXAM:  Oral Hygiene:(PLAQUE): good oral hygiene. LOCATION OF MUCOSITIS: none noted DESCRIPTION OF SALIVA: decreased and foamy saliva. ANY EXPOSED BONE: none noted OTHER WATCHED AREAS: Multiple flexure lesions. Persistent periapical radiolucency at the apex of tooth #30 DX: Xerostomia, Dysgeusia and Multiple flexure lesions and persistent periapical radiolucency at the apex of tooth #30  RECOMMENDATIONS: 1. Brush after meals and at bedtime.  Use fluoride at bedtime. 2. Use trismus exercises as directed. 3. Use ACT, Biotene Rinse, or salt water/baking soda rinses. 4. Multiple sips of water as needed. 5. Return to his primary dentist in 2-3 months for exam and cleaning, evaluation of fexure lesions, and consideration of occlusal splint therapy. Patient to consider follow-up with Dr. Sue Lush for reevaluation of root canal therapy #30.   Lenn Cal, DDS

## 2017-06-23 NOTE — Patient Instructions (Signed)
RECOMMENDATIONS: 1. Brush after meals and at bedtime.  Use fluoride at bedtime. 2. Use trismus exercises as directed. 3. Use ACT, Biotene Rinse, or salt water/baking soda rinses. 4. Multiple sips of water as needed. 5. Return to his primary dentist in 2-3 months for exam and cleaning, evaluation of fexure lesions, and consideration of occlusal splint therapy. Patient to consider follow-up with Dr. Sue Lush for reevaluation of root canal therapy #30.   Lenn Cal, DDS

## 2017-06-25 ENCOUNTER — Encounter (HOSPITAL_COMMUNITY): Payer: Self-pay | Admitting: Dentistry

## 2017-07-08 DIAGNOSIS — H40053 Ocular hypertension, bilateral: Secondary | ICD-10-CM | POA: Diagnosis not present

## 2017-07-08 DIAGNOSIS — H40013 Open angle with borderline findings, low risk, bilateral: Secondary | ICD-10-CM | POA: Diagnosis not present

## 2017-07-08 DIAGNOSIS — Z83511 Family history of glaucoma: Secondary | ICD-10-CM | POA: Diagnosis not present

## 2017-07-08 DIAGNOSIS — H5340 Unspecified visual field defects: Secondary | ICD-10-CM | POA: Diagnosis not present

## 2017-07-10 DIAGNOSIS — D045 Carcinoma in situ of skin of trunk: Secondary | ICD-10-CM | POA: Diagnosis not present

## 2017-07-10 DIAGNOSIS — D044 Carcinoma in situ of skin of scalp and neck: Secondary | ICD-10-CM | POA: Diagnosis not present

## 2017-07-10 DIAGNOSIS — D225 Melanocytic nevi of trunk: Secondary | ICD-10-CM | POA: Diagnosis not present

## 2017-07-10 DIAGNOSIS — L821 Other seborrheic keratosis: Secondary | ICD-10-CM | POA: Diagnosis not present

## 2017-07-10 DIAGNOSIS — Z85828 Personal history of other malignant neoplasm of skin: Secondary | ICD-10-CM | POA: Diagnosis not present

## 2017-07-10 DIAGNOSIS — B356 Tinea cruris: Secondary | ICD-10-CM | POA: Diagnosis not present

## 2017-07-16 DIAGNOSIS — H90A22 Sensorineural hearing loss, unilateral, left ear, with restricted hearing on the contralateral side: Secondary | ICD-10-CM | POA: Diagnosis not present

## 2017-07-16 DIAGNOSIS — C44300 Unspecified malignant neoplasm of skin of unspecified part of face: Secondary | ICD-10-CM | POA: Diagnosis not present

## 2017-08-11 NOTE — Progress Notes (Signed)
Mr. Enriques presents for follow up of radiation completed 05/21/17 to his left parotid area.   Pain issues, if any: He reports pain to his RUQ/ rib area. He report this feels like a nerve pain. It has been occurring for 2 weeks. It hurts when he is sleeping and lays on it.  Using a feeding tube?: N/A Weight changes, if any:  Wt Readings from Last 3 Encounters:  08/19/17 181 lb (82.1 kg)  06/23/17 183 lb (83 kg)  06/06/17 189 lb 12.8 oz (86.1 kg)   Swallowing issues, if any: He reports a decreased appetite. He is not eating a lot at a time. He denies difficulty swallowing. He continues to have dry mouth especially when talking.  Smoking or chewing tobacco? N/A Using fluoride trays daily? He is not using it consistently.  Last ENT visit was on: Dr. Conley Canal 07/17/17 Other notable issues, if any:  He reports continued hearing loss to his Left ear. He reports that it has gotten worse since completing radiation. Dr. Conley Canal is aware.  He has lymphedema noted to his neck and the left side of his face. He has soreness, tender to his jaw. When he performs exercises the soreness improves.  He saw Dr. Elvera Lennox with dermatology on 07/10/17. He has several areas removed CT Chest/ Neck 08/15/17  BP 125/78 (BP Location: Right Arm, Patient Position: Sitting, Cuff Size: Normal)   Pulse 64   Temp 97.8 F (36.6 C) (Oral)   Resp 20   Ht 6' (1.829 m)   Wt 181 lb (82.1 kg)   SpO2 100%   BMI 24.55 kg/m

## 2017-08-15 ENCOUNTER — Ambulatory Visit (HOSPITAL_COMMUNITY)
Admission: RE | Admit: 2017-08-15 | Discharge: 2017-08-15 | Disposition: A | Discharge status: Home / Self Care | Payer: Medicare Other | Source: Ambulatory Visit | Attending: Radiation Oncology | Admitting: Radiation Oncology

## 2017-08-15 ENCOUNTER — Ambulatory Visit
Admission: RE | Admit: 2017-08-15 | Discharge: 2017-08-15 | Disposition: A | Discharge status: Home / Self Care | Payer: Medicare Other | Source: Ambulatory Visit | Attending: Radiation Oncology | Admitting: Radiation Oncology

## 2017-08-15 DIAGNOSIS — C4492 Squamous cell carcinoma of skin, unspecified: Secondary | ICD-10-CM | POA: Diagnosis not present

## 2017-08-15 DIAGNOSIS — C44300 Unspecified malignant neoplasm of skin of unspecified part of face: Secondary | ICD-10-CM

## 2017-08-15 DIAGNOSIS — R221 Localized swelling, mass and lump, neck: Secondary | ICD-10-CM | POA: Insufficient documentation

## 2017-08-15 DIAGNOSIS — I712 Thoracic aortic aneurysm, without rupture: Secondary | ICD-10-CM | POA: Diagnosis not present

## 2017-08-15 DIAGNOSIS — Z923 Personal history of irradiation: Secondary | ICD-10-CM | POA: Insufficient documentation

## 2017-08-15 DIAGNOSIS — Z9889 Other specified postprocedural states: Secondary | ICD-10-CM | POA: Insufficient documentation

## 2017-08-15 LAB — BUN & CREATININE (CHCC)
BUN: 17 mg/dL (ref 8–23)
CREATININE: 0.94 mg/dL (ref 0.61–1.24)
GFR, Est AFR Am: >60 mL/min (ref >60)

## 2017-08-15 MED ORDER — IOHEXOL 300 MG/ML  SOLN
75.0000 mL | Freq: Once | INTRAMUSCULAR | Status: AC | PRN
  Administered 2017-08-15: 75 mL via INTRAVENOUS

## 2017-08-19 ENCOUNTER — Encounter: Payer: Self-pay | Admitting: Radiation Oncology

## 2017-08-19 ENCOUNTER — Other Ambulatory Visit: Payer: Self-pay

## 2017-08-19 ENCOUNTER — Ambulatory Visit
Admission: RE | Admit: 2017-08-19 | Discharge: 2017-08-19 | Disposition: A | Discharge status: Home / Self Care | Payer: Medicare Other | Source: Ambulatory Visit | Attending: Radiation Oncology | Admitting: Radiation Oncology

## 2017-08-19 VITALS — BP 125/78 | HR 64 | Temp 97.8°F | Resp 20 | Ht 72.0 in | Wt 181.0 lb

## 2017-08-19 DIAGNOSIS — C44300 Unspecified malignant neoplasm of skin of unspecified part of face: Secondary | ICD-10-CM | POA: Diagnosis present

## 2017-08-19 DIAGNOSIS — Z08 Encounter for follow-up examination after completed treatment for malignant neoplasm: Secondary | ICD-10-CM | POA: Diagnosis not present

## 2017-08-19 DIAGNOSIS — I890 Lymphedema, not elsewhere classified: Secondary | ICD-10-CM | POA: Insufficient documentation

## 2017-08-19 DIAGNOSIS — C7989 Secondary malignant neoplasm of other specified sites: Secondary | ICD-10-CM | POA: Diagnosis not present

## 2017-08-19 DIAGNOSIS — Z79899 Other long term (current) drug therapy: Secondary | ICD-10-CM | POA: Diagnosis not present

## 2017-08-19 DIAGNOSIS — I712 Thoracic aortic aneurysm, without rupture: Secondary | ICD-10-CM | POA: Diagnosis not present

## 2017-08-19 DIAGNOSIS — Z923 Personal history of irradiation: Secondary | ICD-10-CM | POA: Diagnosis not present

## 2017-08-19 NOTE — Progress Notes (Addendum)
Radiation Oncology         [phone] ________________________________  Name: Timothy Weaver MRN: [id number]  Date: 08/19/2017  DOB: 1940-08-31  Follow-Up Visit Note  CC: Patient, No Pcp Per  Timothy Weaver, *  Diagnosis and Prior Radiotherapy:       ICD-10-CM   1. 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  Narrative:  The patient returns today for routine follow-up of radiation completed on 05/21/2017    On 08/15/2017 he underwent a CT chest with contrast showing, no findings to explain the patient's pain. No evidence of metastatic disease. Ascending Aortic aneurysm NOS (ICD10-I71.9). Recommend annual imaging followup by CTA or MRA. This recommendation follows 2010 ACCF/AHA/AATS/ACR/ASA/SCA/SCAI/SIR/STS/SVM Guidelines for the Diagnosis and Management of Patients with Thoracic Aortic Disease. Circulation. 2010; 121: O242-P536.   On that same day he also had a CT soft tissue neck with contrast showing, postop left parotidectomy and squamous cell carcinoma resection anterior to left external auditory canal. Postop left neck dissection. No adenopathy.   Weight changes, if any:  Wt Readings from Last 3 Encounters:  08/19/17 181 lb (82.1 kg)  06/23/17 183 lb (83 kg)  06/06/17 189 lb 12.8 oz (86.1 kg)    ALLERGIES:  has No Known Allergies.  Meds: Current Outpatient Medications  Medication Sig Dispense Refill  . Cod Liver Oil  1000 MG CAPS Take by mouth.    . LYSINE PO Take by mouth.    . Multiple Vitamins-Minerals (MULTIVITAMIN WITH MINERALS) tablet Take 1 tablet by mouth daily.    . sodium fluoride (FLUORISHIELD) 1.1 % GEL dental gel Instill one drop of gel per tooth space of fluoride tray. Place over teeth for 5 minutes. Remove. Spit out excess. Repeat nightly. 120 mL 11  . vitamin C (ASCORBIC ACID) 500 MG tablet Take 500 mg by mouth daily.    . vitamin E 200 UNIT capsule Take 200 Units by mouth daily.    Marland Kitchen acetaminophen (TYLENOL) 500 MG tablet Take 500 mg by mouth every 6 (six) hours as needed.    Marland Kitchen ibuprofen (ADVIL,MOTRIN) 400 MG tablet Take 400 mg by mouth every 6 (six) hours as needed.    . lidocaine (XYLOCAINE) 2 % solution Patient: Mix 1part 2% viscous lidocaine, 1part H20. Swish & swallow 13mL of diluted mixture, 58min before meals and at bedtime, up to QID (Patient not taking: Reported on 06/06/2017) 100 mL 5   No current facility-administered medications for this encounter.     Physical Findings: Wt Readings from Last 3 Encounters:  08/19/17 181 lb (82.1 kg)  06/23/17 183 lb (83 kg)  06/06/17 189 lb 12.8 oz (86.1 kg)    height is 6' (1.829 m) and weight is 181 lb (82.1 kg). His oral temperature is 97.8 F (36.6 C). His blood pressure is 125/78 and his pulse is 64. His respiration is 20 and oxygen saturation is 100%. .  General: Alert and  oriented, in no acute distress. HEENT: Head is normocephalic. Oral cavity and oropharynx are clear. Neck: lymphedmea in left face and anterior neck. No palpabale masses in the neck. Skin has healed well in the treatment fields.  Skin: Skin in treatment fields shows satisfactory healing.    Lab Findings: Lab Results  Component Value Date   WBC 6.6 03/25/2017   HGB 14.8 03/25/2017   HCT 45.2 03/25/2017   MCV 90.2 03/25/2017   PLT 270 03/25/2017    Lab Results  Component Value Date   TSH 1.091 03/25/2017    Radiographic Findings: I have personally reviewed  his imaging.   Ct Soft Tissue Neck W Contrast  Result Date: 08/15/2017 CLINICAL DATA:  Squamous cell carcinoma scan. Post surgical and radiation therapy. EXAM: CT NECK WITH CONTRAST TECHNIQUE: Multidetector CT imaging of the neck was performed using the standard protocol following the bolus administration of intravenous contrast. CONTRAST:  81mL OMNIPAQUE IOHEXOL 300 MG/ML  SOLN COMPARISON:  CT neck 02/17/2017 FINDINGS: Pharynx and larynx: Post radiation changes with mild edema in the larynx and epiglottis. Normal pharynx and oral cavity. Airway intact. Salivary glands: Postsurgical changes anterior to the left external auditory canal due to tumor resection. Partial resection left parotid gland. This will serve as a baseline for future reference. Right parotid normal. Left submandibular gland obscured due to surrounding edema. Surgical clips in the area, possible resection. Right submandibular gland negative Thyroid: Negative Lymph nodes: Left neck dissection with multiple surgical clips and diffuse soft tissue edema. No abscess or mass. No enlarged lymph nodes. Vascular: Left jugular vein is patent but narrowed due to surrounding soft tissue edema. Right jugular vein patent. Both carotid arteries patent. Limited intracranial: Negative Visualized orbits: Not imaged Mastoids and visualized paranasal sinuses: Negative. Mastoid sinus clear bilaterally. Skeleton: Cervical spine degenerative changes with disc space narrowing and spurring and diffuse facet degeneration. No bony lesion identified. Upper chest: Chest CT from today reported separately Other: Image quality degraded by motion. IMPRESSION: Postop left parotidectomy and squamous cell carcinoma resection anterior to left external auditory canal. Postop left neck dissection. No adenopathy Postsurgical edema left neck. Radiation induced edema in the larynx. Electronically Signed   By: Franchot Gallo M.D.   On: 08/15/2017 15:01   Ct Chest W Contrast  Result  Date: 08/15/2017 CLINICAL DATA:  Squamous cell skin cancer. Right upper quadrant pain. EXAM: CT CHEST WITH CONTRAST TECHNIQUE: Multidetector CT imaging of the chest was performed during intravenous contrast administration. CONTRAST:  37mL OMNIPAQUE IOHEXOL 300 MG/ML  SOLN COMPARISON:  03/25/2017. FINDINGS: Cardiovascular: Ascending aorta measures 4.3 cm. Coronary artery calcification. Heart size normal. No pericardial effusion. Mediastinum/Nodes: No pathologically enlarged mediastinal, hilar or axillary lymph nodes. Esophagus is grossly unremarkable. Lungs/Pleura: Biapical pleuroparenchymal scarring. Lungs are clear. No pleural fluid. Airway is unremarkable. Upper Abdomen: Visualized portions of the liver, gallbladder, adrenal glands, kidneys, spleen, pancreas, stomach and bowel are grossly unremarkable. No upper abdominal adenopathy. Musculoskeletal: Degenerative changes in the spine. No worrisome lytic or sclerotic lesions. IMPRESSION: 1. No findings to explain the patient's pain. No evidence of metastatic disease. 2. Ascending Aortic aneurysm NOS (ICD10-I71.9). Recommend annual imaging followup by CTA or MRA. This recommendation follows 2010 ACCF/AHA/AATS/ACR/ASA/SCA/SCAI/SIR/STS/SVM Guidelines for the Diagnosis and Management of Patients with Thoracic Aortic Disease. Circulation. 2010; 121: M094-B096. 3. CT neck dictated separately. Electronically Signed   By: Lorin Picket M.D.   On: 08/15/2017 14:43    Impression/Plan:    1) Head and Neck Cancer Status: No evidence of diease.  Follow-up with Dr. Conley Canal in 09/2017  2) Ascending thoracic aneurysm in neck on CT, discussed today. Will refer to cardiothoracic surgery   3) Patient does not have a PCP. I recommended he talk to friends and family about finding a good fit for his needs in [place].   4) Lymphedema in neck, refer back to PT.  5) Follow-up in 01/2018, The patient was encouraged to call with any issues or questions before then.   Will  present at ENT board tomorrow  I spent at least 15 minutes face to face with the patient, over 50% on counseling and care coordination. _____________________________________   Eppie Gibson, MD  This document serves as a record of services personally performed by Eppie Gibson, MD. It was created on her behalf by Margit Banda, a trained medical scribe. The creation of this record is based on the scribe's personal observations and the provider's statements to them. This document has been checked and approved by the attending provider.

## 2017-08-22 ENCOUNTER — Other Ambulatory Visit: Payer: Self-pay | Admitting: Radiation Oncology

## 2017-08-22 ENCOUNTER — Encounter: Payer: Self-pay | Admitting: Radiation Oncology

## 2017-08-22 DIAGNOSIS — C44300 Unspecified malignant neoplasm of skin of unspecified part of face: Secondary | ICD-10-CM

## 2017-08-22 DIAGNOSIS — I712 Thoracic aortic aneurysm, without rupture, unspecified: Secondary | ICD-10-CM

## 2017-08-25 ENCOUNTER — Ambulatory Visit: Payer: Medicare Other | Attending: Radiation Oncology | Admitting: Rehabilitation

## 2017-08-25 ENCOUNTER — Encounter: Payer: Self-pay | Admitting: Rehabilitation

## 2017-08-25 ENCOUNTER — Other Ambulatory Visit: Payer: Self-pay

## 2017-08-25 DIAGNOSIS — M25612 Stiffness of left shoulder, not elsewhere classified: Secondary | ICD-10-CM | POA: Diagnosis not present

## 2017-08-25 DIAGNOSIS — I890 Lymphedema, not elsewhere classified: Secondary | ICD-10-CM | POA: Diagnosis not present

## 2017-08-25 DIAGNOSIS — R293 Abnormal posture: Secondary | ICD-10-CM | POA: Insufficient documentation

## 2017-08-25 DIAGNOSIS — C44300 Unspecified malignant neoplasm of skin of unspecified part of face: Secondary | ICD-10-CM | POA: Diagnosis not present

## 2017-08-25 DIAGNOSIS — M436 Torticollis: Secondary | ICD-10-CM | POA: Insufficient documentation

## 2017-08-25 NOTE — Therapy (Signed)
[place], [place], [address] Phone: [phone]   Fax:  [phone]  Physical Therapy Evaluation  Patient Details  Name: Timothy Weaver MRN: [id number] Date of Birth: [birth year] Referring Provider: Dr. Eppie Gibson   Encounter Date: 08/25/2017  PT End of Session - 08/25/17 1548    Visit Number  1    Number of Visits  12    Date for PT Re-Evaluation  10/06/17    PT Start Time  7169    PT Stop Time  1430    PT Time Calculation (min)  45 min    Activity Tolerance  Patient tolerated treatment well    Behavior During Therapy  [facility] for tasks assessed/performed       Past Medical History:  Diagnosis Date  . Facial abscess 01/08/2017  . History of radiation therapy 04/10/17- 05/21/17   Left Parotid treated to 60 Gy with 30 fractions.   . Shingles 2004  . Squamous cell carcinoma of parotid ([place]) 02/11/2017    Past Surgical History:  Procedure Laterality Date  . auriculectomy Left 02/25/2017   Dr. [name]- [facility].   . hearing loss    . MOUTH SURGERY    . NECK DISSECTION Left 02/25/2017   Dr. [name], [facility].   Marland Kitchen PAROTIDECTOMY Left 02/25/2017   Dr. [name] [facility]  . TONSILLECTOMY    . WISDOM TOOTH EXTRACTION      There were no vitals filed for this visit.   Subjective Assessment - 08/25/17 1346    Subjective  Pt presents with swelling of the Lt side of the face worsening x 2 weeks as well as L shoulder pain and dysfunction after completing radiation.      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  No/denies    Pain Score  0-No pain some tightness in the jaw when opening the mouth         [facility] PT Assessment - 08/25/17 0001      Assessment   Medical Diagnosis  L Cutaneous Carcinoma of the Head and Neck    Referring Provider  Dr. Eppie Gibson    Onset Date/Surgical Date  02/25/17    Hand Dominance  Right    Next MD Visit  unknown    Prior Therapy  n      Precautions   Precaution Comments  cancer, no deep abdominal due to aneurysm      Restrictions   Weight Bearing Restrictions  No      Balance Screen   Has the patient fallen in the past 6 months  No    Has the patient had a decrease in activity level because of a fear of falling?   No    Is the patient reluctant to leave their home because of a fear of falling?   No      Home Environment   Living Environment  Private residence    Living Arrangements  Spouse/significant other    Type of [place]  Two level      Prior Function   Level  of Independence  Independent    Vocation  Retired    Leisure  starting to walk again      [employer]   Overall Cognitive Status  Within Functional Limits for tasks assessed      Observation/Other Assessments   Observations  well healed incision from mid left cheek to the L ear and down to the neck.  Discoloration and slight atrophy from radiation      Sensation   Additional Comments  reported numbness lateral face      Coordination   Gross Motor Movements are Fluid and Coordinated  Yes      ROM / Strength   AROM / PROM / Strength  AROM;PROM;Strength      AROM   AROM Assessment Site  Cervical;Shoulder    Right/Left Shoulder  Right;Left    Right Shoulder Flexion  170 Degrees    Right Shoulder ABduction  170 Degrees    Left Shoulder Flexion  132 Degrees    Left Shoulder ABduction  140 Degrees * pain and abherrant scapular motion most likely nerve palsy    Cervical Flexion  40    Cervical Extension  40    Cervical - Right Side Bend  21 pull    Cervical - Left Side Bend   15 pull    Cervical - Right Rotation  60    Cervical - Left Rotation  70      Strength   Overall Strength Comments  serratus weakness with punch test, strength WNL but with substitutions by the Rt shoulder to stabilize    Strength Assessment Site  Shoulder    Right/Left Shoulder  Right;Left        LYMPHEDEMA/ONCOLOGY QUESTIONNAIRE - 08/25/17 1344      Type   Cancer Type  left facial skin and parotid       Surgeries   Other Surgery Date  02/25/17    Number Lymph Nodes Removed  25      Date Lymphedema/Swelling Started   Date  08/04/17      Treatment   Active Chemotherapy Treatment  No    Past Chemotherapy Treatment  No    Active Radiation Treatment  No    Past Radiation Treatment  Yes    Date  05/21/17    Current Hormone Treatment  No    Past Hormone Therapy  No      What other symptoms do you have   Are you Having Heaviness or Tightness  No    Are you having Pain  No    Are you having pitting edema  No    Is it Hard or Difficult finding clothes that fit  No    Do you have infections  No    Is there Decreased scar mobility  Yes    Stemmer Sign  No      Lymphedema Assessments   Lymphedema Assessments  Head and Neck      Head and Neck   Right Lateral Nostril at base of nose to medial tragus   11.8 cm    Left Lateral Nostril at base of nose to medial tragus   12.8 cm    Right Corner of mouth to where ear lobe meets face  11 cm    Left Corner of mouth to where ear lobe meets face  12.5 cm    Other  10cm superior; 44cm              Outpatient Rehab from 08/25/2017  in [facility]  Lymphedema Life Impact Scale Total Score  8.82 %      Objective measurements completed on examination: See above findings.      [place] [facility] PT Treatment/Exercise - 08/25/17 0001      Manual Therapy   Manual Therapy  Edema management    Edema Management  given chip pack for chin and Lt cheek to use 64min-3hours per day, to stop sleeping on the  Lt side and more elevated              PT Education - 08/25/17 1547    Education Details  POC, chip pack use    Person(s) Educated  Patient    Methods  Explanation    Comprehension  Verbalized understanding;Returned demonstration          PT Long Term Goals - 08/25/17 1601      PT LONG TERM GOAL #1   Title  Pt will be independent with self MLD for the face and neck    Time  6    Period  Weeks    Status  New    Target Date  10/06/17      PT LONG TERM GOAL #2   Title  Pt will obtain correct facial compression garment for home use    Time  6    Period  Weeks    Status  New    Target Date  10/06/17      PT LONG TERM GOAL #3   Title  Pt will decrease facial measurements to: Nose to ear: 11.8 and mouth to ear: 11    Time  6    Period  Weeks    Status  New    Target Date  10/06/17      PT LONG TERM GOAL #4   Title  Pt will elevate the Lt shoulder to 120degrees without abherrant scapular movement due to palsy    Time  6    Period  Weeks    Status  New    Target Date  10/06/17      PT LONG TERM GOAL #5   Title  Pt will be ind with HEP for continued Lt shoulder stability and cervical mobility     Time  6    Period  Weeks    Status  New    Target Date  10/06/17             Plan - 08/25/17 1536    Clinical Impression Statement  Pt arrives today with new onset of Lt cheeck and neck swelling after radiation was complete to the Lt neck/parotid area and base of the skull x about 3 weeks ago.  His cervical ROM is not significantly limited but with some tightness with lateral flexion bilaterally.  He does have significant dysfunction with Lt scapular control with apparent spinal accessory nerve palsy.  Pt is doing well to compensate but with some pain.      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 stage III, neck dissection/parotidectomy/tonsillectomy 02/25/17, Radiation to the Left parotid/ left neck / skull  base treated to 60 Gy in 30 fractions, aortic aneurysm under watch currently    Clinical Presentation  Evolving    Clinical Presentation due to:  new onset swelling and shoulder weakness    Clinical Decision Making  Moderate    Rehab Potential  Good    Clinical Impairments Affecting  Rehab Potential  possible complete nerve loss    PT Frequency  2x / week    PT Duration  6 weeks    PT Treatment/Interventions  Scar mobilization;Neuromuscular re-education;Manual techniques;Taping;Therapeutic exercise;Patient/family education;Manual lymph drainage    PT Next Visit Plan  start MLD face (no abdominal due to aneurysm breathing only), teach self MLD, how is chip pack?  Eventually: cervical stretching HEP, treat Lt assessory nerve palsy with tape, scapular/RTC strengthening    Consulted and Agree with Plan of Care  Patient       Patient will benefit from skilled therapeutic intervention in order to improve the following deficits and impairments:  Pain, Decreased mobility, Postural dysfunction, Decreased activity tolerance, Decreased range of motion, Decreased strength, Impaired UE functional use, Increased edema  Visit Diagnosis: Skin cancer of face  Abnormal posture  Stiffness of left shoulder, not elsewhere classified  Neck stiffness  Lymphedema, not elsewhere classified     Problem List Patient Active Problem List   Diagnosis Date Noted  . Cancer of skin of face 03/19/2017    Shan Levans, PT 08/25/2017, 4:05 PM  [place], [place], [address] Phone: [phone]   Fax:  [phone]  Name: Timothy Weaver MRN: [id number] Date of Birth: 03-18-1940

## 2017-08-25 NOTE — Patient Instructions (Signed)
Wear chip back 34min-3hrs every night, sleep elevated and not on Lt side

## 2017-09-02 ENCOUNTER — Encounter: Payer: Self-pay | Admitting: Physical Therapy

## 2017-09-02 ENCOUNTER — Ambulatory Visit: Payer: Medicare Other | Admitting: Physical Therapy

## 2017-09-02 DIAGNOSIS — M436 Torticollis: Secondary | ICD-10-CM

## 2017-09-02 DIAGNOSIS — R293 Abnormal posture: Secondary | ICD-10-CM

## 2017-09-02 DIAGNOSIS — I890 Lymphedema, not elsewhere classified: Secondary | ICD-10-CM

## 2017-09-02 DIAGNOSIS — M25612 Stiffness of left shoulder, not elsewhere classified: Secondary | ICD-10-CM

## 2017-09-02 DIAGNOSIS — C44300 Unspecified malignant neoplasm of skin of unspecified part of face: Secondary | ICD-10-CM | POA: Diagnosis not present

## 2017-09-02 NOTE — Patient Instructions (Signed)
Manual lymph drainage for the neck, anterior approach  Sit in front of a mirror. Do 5 slow deep breaths, breathing in through the nose and out through the mouth, letting your belly "inflate" as you breathe in.  Rest your hands on your abdomen as you do this to give slight pressure there.  1) Place hands on areas just behind collar bones and do 10 stationary circles with stretch in outward directions. 2) Do stationary circles at each armpit about 10 times. 3) Place one hand on the front of the opposite shoulder and do stationary circles with stretch downward toward underarm. 4) Repeat #1 above. 5) Imagine a river running in a line from the earlobe straight down the neck.  Place hands just behind this and do circles with stretch coming forward slightly and down, thinking about fluid flowing down that river.  Do 10 times. 6) Place one hand just in front of the river on one side and do circles with a slight back and then downward stretch, thinking again about putting that fluid in the river.  Do 10-20 times on each side. 7) Place one hand just slightly in front of the spot you just did and do the same thing.  DO THIS VERY GENTLY. 8) Use the webspace between your thumb and index finger to pump downward starting just under the chin and "stair stepping" downward with a stretch, working down to the chest. 9) Repeat #1. 10) Do stationary circles on each side of the face just above the chin, out and down with the stretch 10 times. 11) Do stationary circles on each side of the face on the cheeks with pressure going back and down, 10 times. 12) Do stationary circles on each side of the face between the eyes and ears, again back and downward 10 times. 13) Repeat steps 9,8,7,6,5,1,3 and 2 in that order!  Do not slide on the skin, but STRETCH it with your motions. Only give enough pressure to stretch the skin. DO THIS SLOWLY, PLEASE!  And do once a day. 

## 2017-09-02 NOTE — Therapy (Signed)
[place], [place], [address] Phone: [phone]   Fax:  [phone]  Physical Therapy Treatment  Patient Details  Name: Timothy Weaver MRN: [id number] Date of Birth: [birth year] Referring Provider: Dr. Eppie Gibson   Encounter Date: 09/02/2017  PT End of Session - 09/02/17 1814    Visit Number  2    Number of Visits  12    Date for PT Re-Evaluation  10/06/17    PT Start Time  9449    PT Stop Time  1515    PT Time Calculation (min)  50 min    Activity Tolerance  Patient tolerated treatment well    Behavior During Therapy  [facility] for tasks assessed/performed       Past Medical History:  Diagnosis Date  . Facial abscess 01/08/2017  . History of radiation therapy 04/10/17- 05/21/17   Left Parotid treated to 60 Gy with 30 fractions.   . Shingles 2004  . 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 auriculectomy Left 02/25/2017   Dr. [name]- [facility].   . hearing loss    . MOUTH SURGERY    . NECK DISSECTION Left 02/25/2017   Dr. [name], [facility].   Marland Kitchen PAROTIDECTOMY Left 02/25/2017   Dr. [name] [facility]  . TONSILLECTOMY    . WISDOM TOOTH EXTRACTION      There were no vitals filed for this visit.  Subjective Assessment - 09/02/17 1807    Subjective  Pt states he has been using the chip pack for 10-15 minutes at a time, but hasn't noticed much difference     Pertinent History  Cutaneous Carcinoma of the Head and Neck stage III, neck dissection/parotidectomy/tonsillectomy 02/25/17, Radiation to the Left parotid/ left neck / skull base treated to 60 Gy in 30 fractions, aortic aneurysm under watch currently    Patient Stated Goals  decrease swelling in the face and improve Lt shoulder function if possible    Currently in Pain?  No/denies                  Outpatient [facility]25/2017 in  [facility]  Lymphedema Life Impact Scale Total Score  8.82 %           [facility] Adult PT Treatment/Exercise - 09/02/17 0001      Neck Exercises: Seated   Cervical Rotation  Right;Left    Lateral Flexion  Right;Left    Shoulder Rolls  Backwards;5 reps    Other Seated Exercise  scapular retraction       Manual Therapy   Manual Therapy  Edema management;Manual Lymphatic Drainage (MLD);Taping    Edema Management  showed pt jovi pack and gave him handout should he like to look for it online     Manual Lymphatic Drainage (MLD)  in sitting, instructed in diaphragmatic breathing, short neck, shoulder circles, stationary circles to left lateral and anteior neck,moving to down toward chest, left cheek and face and retracing lateral neck and moving posteriorly to back of neck  using handout as a guide.     Kinesiotex  Edema      Kinesiotix   Edema  skin kote to left neck an cheek, then 4 pronged fan shape going from cheek and under chin to posterior upper trap              PT Education - 09/02/17 1813  Education Details  remove kinesiotape gently rolling with oil on a cotton ball at any sign of discomfort , continue use of chip pack , began teaching self MLD     Person(s) Educated  Patient    Methods  Explanation    Comprehension  Verbalized understanding;Need further instruction          PT Long Term Goals - 08/25/17 1601      PT LONG TERM GOAL #1   Title  Pt will be independent with self MLD for the face and neck    Time  6    Period  Weeks    Status  New    Target Date  10/06/17      PT LONG TERM GOAL #2   Title  Pt will obtain correct facial compression garment for home use    Time  6    Period  Weeks    Status  New    Target Date  10/06/17      PT LONG TERM GOAL #3   Title  Pt will decrease facial measurements to: Nose to ear: 11.8 and mouth to ear: 11    Time  6    Period  Weeks    Status  New    Target Date  10/06/17       PT LONG TERM GOAL #4   Title  Pt will elevate the Lt shoulder to 120degrees without abherrant scapular movement due to palsy    Time  6    Period  Weeks    Status  New    Target Date  10/06/17      PT LONG TERM GOAL #5   Title  Pt will be ind with HEP for continued Lt shoulder stability and cervical mobility     Time  6    Period  Weeks    Status  New    Target Date  10/06/17            Plan - 09/02/17 1814    Clinical Impression Statement  Pt still with visible edema in left cheek and palpable fullness in left lateral neck.  Tried kinsiotape today and encourage use of chip pack to this area as well as began education in manual lymph draiange     Rehab Potential  Good    Clinical Impairments Affecting Rehab Potential  possible complete nerve loss    PT Frequency  2x / week    PT Treatment/Interventions  Scar mobilization;Neuromuscular re-education;Manual techniques;Taping;Therapeutic exercise;Patient/family education;Manual lymph drainage    PT Next Visit Plan  continue MLD face (no abdominal due to aneurysm breathing only), continue teaching self care and help with getting compression garment.   Eventually: cervical stretching HEP, treat Lt assessory nerve palsy with tape, scapular/RTC strengthening    Consulted and Agree with Plan of Care  Patient       Patient will benefit from skilled therapeutic intervention in order to improve the following deficits and impairments:  Pain, Decreased mobility, Postural dysfunction, Decreased activity tolerance, Decreased range of motion, Decreased strength, Impaired UE functional use, Increased edema  Visit Diagnosis: Skin cancer of face  Abnormal posture  Stiffness of left shoulder, not elsewhere classified  Neck stiffness  Lymphedema, not elsewhere classified     Problem List Patient Active Problem List   Diagnosis Date Noted  . Cancer of skin of face 03/19/2017   Donato Heinz. Owens Shark, PT  Norwood Levo 09/02/2017,  6:18 PM  [facility]  [place], [place], [address] Phone: [phone]   Fax:  [phone]  Name: OBI SCRIMA MRN: [id number] Date of Birth: [birth year]

## 2017-09-04 ENCOUNTER — Ambulatory Visit: Payer: Medicare Other | Attending: Radiation Oncology | Admitting: Physical Therapy

## 2017-09-04 DIAGNOSIS — I890 Lymphedema, not elsewhere classified: Secondary | ICD-10-CM | POA: Diagnosis present

## 2017-09-04 DIAGNOSIS — M25612 Stiffness of left shoulder, not elsewhere classified: Secondary | ICD-10-CM | POA: Diagnosis present

## 2017-09-04 DIAGNOSIS — R293 Abnormal posture: Secondary | ICD-10-CM | POA: Diagnosis present

## 2017-09-04 DIAGNOSIS — M436 Torticollis: Secondary | ICD-10-CM | POA: Diagnosis present

## 2017-09-04 NOTE — Therapy (Signed)
[place], [place], [address] Phone: [phone]   Fax:  [phone]  Physical Therapy Treatment  Patient Details  Name: Timothy Weaver MRN: [id number] Date of Birth: 1940-04-01 Referring Provider: Dr. Eppie Gibson   Encounter Date: 09/04/2017  PT End of Session - 09/04/17 1631    Visit Number  3    Number of Visits  12    Date for PT Re-Evaluation  10/06/17    PT Start Time  1430    PT Stop Time  1515    PT Time Calculation (min)  45 min    Activity Tolerance  Patient tolerated treatment well    Behavior During Therapy  [facility] for tasks assessed/performed       Past Medical History:  Diagnosis Date  . Facial abscess 01/08/2017  . History of radiation therapy 04/10/17- 05/21/17   Left Parotid treated to 60 Gy with 30 fractions.   . Shingles 2004  . 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 auriculectomy Left 02/25/2017   Dr. [name]- [facility].   . hearing loss    . MOUTH SURGERY    . NECK DISSECTION Left 02/25/2017   Dr. [name], [facility].   Marland Kitchen PAROTIDECTOMY Left 02/25/2017   Dr. [name] [facility]  . TONSILLECTOMY    . WISDOM TOOTH EXTRACTION      There were no vitals filed for this visit.  Subjective Assessment - 09/04/17 1431    Subjective  Pt comes in wearing the kinesiotape.  He says he feels that his cheek feels less full.  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  No/denies            LYMPHEDEMA/ONCOLOGY  QUESTIONNAIRE - 09/04/17 1438      Head and Neck   Right Lateral Nostril at base of nose to medial tragus   11.5 cm    Left Lateral Nostril at base of nose to medial tragus   12.2 cm    Right Corner of mouth to where ear lobe meets face  10 cm    Left Corner of mouth to where ear lobe meets face  12 cm           Outpatient Rehab from 08/25/2017 in [place]  Lymphedema Life Impact Scale Total Score  8.82 %           [facility] Adult PT Treatment/Exercise - 09/04/17 0001      Neck Exercises: Seated   Other Seated Exercise  reinforced neck ROM and stretching       Shoulder Exercises: Standing   Row  Strengthening;Right;Left;10 reps;Theraband    Theraband Level (Shoulder Row)  Level 1 (Yellow)    Row Limitations  low rows to hip with elbow straight, provided verbal and tactile cues       Manual Therapy   Manual Therapy  Edema management;Manual Lymphatic Drainage (MLD);Taping    Edema Management  instructed pt in how to apply kinesiotape and gave him address  of where to go buy it.  remeasured for lymphedema today     Manual Lymphatic Drainage (MLD)  in sitting, instructed in diaphragmatic breathing, short neck, shoulder circles, stationary circles to left lateral and anteior neck,moving to down toward chest, left cheek and face and retracing lateral neck and moving posteriorly to back of neck  using handout as a guide.     Kinesiotex  Edema      Kinesiotix   Edema  skin kote to left upper chest and chin , then 4 pronged fan shape going from under chin towards axilla repositioned tape to prevent skin irritation                   PT Long Term Goals - 09/04/17 1636      PT LONG TERM GOAL #1   Title  Pt will be independent with self MLD for the face and neck    Time  6    Period  Weeks    Status  On-going      PT LONG TERM GOAL #2   Title  Pt will obtain correct facial compression garment for home use    Time  6    Period  Weeks     Status  On-going      PT LONG TERM GOAL #3   Title  Pt will decrease facial measurements to: Nose to ear: 11.8 and mouth to ear: 11    Status  On-going      PT LONG TERM GOAL #4   Title  Pt will elevate the Lt shoulder to 120degrees without abherrant scapular movement due to palsy    Status  On-going      PT LONG TERM GOAL #5   Title  Pt will be ind with HEP for continued Lt shoulder stability and cervical mobility     Status  On-going            Plan - 09/04/17 1631    Clinical Impression Statement  Pt presents with much less visible edema in left cheeck with softer tissue at lateral neck that he attributes to the kinesiotape.  He has been trying to so the manual lymph draiange at home and wearing the chip pack.  He is not ready to get a compression garment and hopes he doesn't need it for the long term. Pt played close attention to MLD today and feels like he understands it bette.  Added some standing upper back exercise today for scapular activation.  Improvement in lymphedema measurement noted     Clinical Impairments Affecting Rehab Potential  possible complete nerve loss    PT Treatment/Interventions  Scar mobilization;Neuromuscular re-education;Manual techniques;Taping;Therapeutic exercise;Patient/family education;Manual lymph drainage    PT Next Visit Plan  Add supine scap series this session continue MLD face (no abdominal due to aneurysm breathing only), assess if he is doing ok with self MLD.  Reapply kinesiotape if skin in ok. help with getting compression garment if needed    Eventually: cervical stretching HEP, treat Lt assessory nerve palsy with tape, scapular/RTC strengthening    Consulted and Agree with Plan of Care  Patient       Patient will benefit from skilled therapeutic intervention in order to improve the following deficits and impairments:  Pain, Decreased mobility, Postural dysfunction, Decreased activity tolerance, Decreased range of motion, Decreased  strength, Impaired UE functional use, Increased edema  Visit Diagnosis: Abnormal posture  Stiffness of left shoulder, not elsewhere classified  Neck stiffness  Lymphedema, not elsewhere classified     Problem List Patient Active Problem List   Diagnosis Date Noted  . Cancer of skin of face 03/19/2017   Donato Heinz. Owens Shark PT  Norwood Levo 09/04/2017, 4:38 PM  [place], [place], [address] Phone: [phone]   Fax:  [phone]  Name: Timothy Weaver MRN: [id number] Date of Birth: 17-Aug-1940

## 2017-09-09 ENCOUNTER — Ambulatory Visit: Payer: Medicare Other

## 2017-09-09 DIAGNOSIS — R293 Abnormal posture: Secondary | ICD-10-CM

## 2017-09-09 DIAGNOSIS — M25612 Stiffness of left shoulder, not elsewhere classified: Secondary | ICD-10-CM

## 2017-09-09 DIAGNOSIS — I890 Lymphedema, not elsewhere classified: Secondary | ICD-10-CM

## 2017-09-09 DIAGNOSIS — M436 Torticollis: Secondary | ICD-10-CM

## 2017-09-09 NOTE — Therapy (Signed)
[place], [place], [address] Phone: [phone]   Fax:  [phone]  Physical Therapy Treatment  Patient Details  Name: Timothy Weaver MRN: [id number] Date of Birth: 17-Nov-1940 Referring Provider: Dr. Eppie Gibson   Encounter Date: 09/09/2017  PT End of Session - 09/09/17 1523    Visit Number  4    Number of Visits  12    Date for PT Re-Evaluation  10/06/17    PT Start Time  1025    PT Stop Time  1515    PT Time Calculation (min)  55 min    Activity Tolerance  Patient tolerated treatment well    Behavior During Therapy  [facility] for tasks assessed/performed       Past Medical History:  Diagnosis Date  . Facial abscess 01/08/2017  . History of radiation therapy 04/10/17- 05/21/17   Left Parotid treated to 60 Gy with 30 fractions.   . Shingles 2004  . 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 auriculectomy Left 02/25/2017   Dr. [name]- [facility].   . hearing loss    . MOUTH SURGERY    . NECK DISSECTION Left 02/25/2017   Dr. [name], [facility].   Marland Kitchen PAROTIDECTOMY Left 02/25/2017   Dr. [name] [facility]  . TONSILLECTOMY    . WISDOM TOOTH EXTRACTION      There were no vitals filed for this visit.  Subjective Assessment - 09/09/17 1424    Subjective  My Lt cheek was just about normal after last session but I could feel it filling back up starting Friday evening and now it's back to ebing swollen again. I've also been doing the self manual lymph drainage 1-2x/day.     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  No/denies                  Outpatient Re[facility] from [facility]/25/2017 [facility]  Lymphedema Life Impact Scale Total Score  8.82 %           [facility] Adult PT Treatment/Exercise - 09/09/17 0001      Shoulder Exercises: Supine   Other Supine Exercises  Scapular series with red theraband all 10 times each per handout except 5x of diagonals      Manual Therapy   Manual Lymphatic Drainage (MLD)  In Supine: Short and long neck, bil shoulder collectors, bil axillary nodes, 5 diaphragmatic breaths, anterior and lateral throat, submental nodes, bil masseters, pre-and retroauricular nodes, and suboccipital nodes then retracing all steps and reviewing pressure with pt throughout and answerin ghis questions regarding technique.    Kinesiotex  Edema      Kinesiotix   Edema  skin kote to left superior shoulder, then 4 pronged fan shape going from cheek to superior shoulder             PT Education - 09/09/17 1434    Education Details  Supine scapular series with red theraband    Person(s) Educated  Patient    Methods  Explanation;Demonstration;Handout    Comprehension  Verbalized understanding;Returned  demonstration          PT Long Term Goals - 09/04/17 1636      PT LONG TERM GOAL #1   Title  Pt will be independent with self MLD for the face and neck    Time  6    Period  Weeks    Status  On-going      PT LONG TERM GOAL #2   Title  Pt will obtain correct facial compression garment for home use    Time  6    Period  Weeks    Status  On-going      PT LONG TERM GOAL #3   Title  Pt will decrease facial measurements to: Nose to ear: 11.8 and mouth to ear: 11    Status  On-going      PT LONG TERM GOAL #4   Title  Pt will elevate the Lt shoulder to 120degrees without abherrant scapular movement due to palsy    Status  On-going      PT LONG TERM GOAL #5   Title  Pt will be ind with HEP for continued Lt  shoulder stability and cervical mobility     Status  On-going            Plan - 09/09/17 1538    Clinical Impression Statement  Instructed pt supine scapular series with red theraband. He tolerated this very well reporting he could feel the muscles working without compensating. Reviewed manual lymph drainage while performing on pt answering his questions and confirming correct pressure throughout. Also reapplied kinesiotape onto cheek as he reported this very beneficial after the first application.     Rehab Potential  Good    Clinical Impairments Affecting Rehab Potential  possible complete nerve loss    PT Frequency  2x / week    PT Duration  6 weeks    PT Treatment/Interventions  Scar mobilization;Neuromuscular re-education;Manual techniques;Taping;Therapeutic exercise;Patient/family education;Manual lymph drainage    PT Next Visit Plan  Review supine scap series this session continue MLD face (no abdominal due to aneurysm breathing only), assess if he is doing ok with self MLD.  Reapply kinesiotape if skin in ok. help with getting compression garment if needed    Eventually: cervical stretching HEP, treat Lt assessory nerve palsy with tape, scapular/RTC strengthening    Consulted and Agree with Plan of Care  Patient       Patient will benefit from skilled therapeutic intervention in order to improve the following deficits and impairments:  Pain, Decreased mobility, Postural dysfunction, Decreased activity tolerance, Decreased range of motion, Decreased strength, Impaired UE functional use, Increased edema  Visit Diagnosis: Abnormal posture  Stiffness of left shoulder, not elsewhere classified  Neck stiffness  Lymphedema, not elsewhere classified     Problem List Patient Active Problem List   Diagnosis Date Noted  . Cancer of skin of face 03/19/2017    Otelia Limes, PTA 09/09/2017, 3:55 PM  [place], [place], [address] Phone: [phone]   Fax:  [phone]  Name: Timothy Weaver MRN: [id number] Date of Birth: 07-14-1940

## 2017-09-09 NOTE — Patient Instructions (Signed)

## 2017-09-11 ENCOUNTER — Ambulatory Visit: Payer: Medicare Other | Admitting: Physical Therapy

## 2017-09-11 ENCOUNTER — Encounter: Payer: Self-pay | Admitting: Physical Therapy

## 2017-09-11 DIAGNOSIS — R293 Abnormal posture: Secondary | ICD-10-CM

## 2017-09-11 DIAGNOSIS — M436 Torticollis: Secondary | ICD-10-CM

## 2017-09-11 DIAGNOSIS — M25612 Stiffness of left shoulder, not elsewhere classified: Secondary | ICD-10-CM

## 2017-09-11 DIAGNOSIS — I890 Lymphedema, not elsewhere classified: Secondary | ICD-10-CM

## 2017-09-11 NOTE — Therapy (Signed)
[place], [place], [address] Phone: [phone]   Fax:  [phone]  Physical Therapy Treatment  Patient Details  Name: Timothy Weaver MRN: [id number] Date of Birth: May 15, 1940 Referring Provider: Dr. Eppie Gibson   Encounter Date: 09/11/2017  PT End of Session - 09/11/17 1411    Visit Number  5    Number of Visits  12    Date for PT Re-Evaluation  10/06/17    PT Start Time  1300    PT Stop Time  1345    PT Time Calculation (min)  45 min       Past Medical History:  Diagnosis Date  . Facial abscess 01/08/2017  . History of radiation therapy 04/10/17- 05/21/17   Left Parotid treated to 60 Gy with 30 fractions.   . Shingles 2004  . 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 auriculectomy Left 02/25/2017   Dr. [name]- [facility].   . hearing loss    . MOUTH SURGERY    . NECK DISSECTION Left 02/25/2017   Dr. [name], [facility].   Marland Kitchen PAROTIDECTOMY Left 02/25/2017   Dr. [name] [facility]  . TONSILLECTOMY    . WISDOM TOOTH EXTRACTION      There were no vitals filed for this visit.  Subjective Assessment - 09/11/17 1358    Subjective  Pt reports he got improvement after last session.  He says he just took the kinesiotape off a little while ago.  He has only been wearing his chip pack 10-15 minutes at a time.  He has been doing his shoulder exercises without a problem     Pertinent History  Cutaneous Carcinoma of the Head and Neck stage III, neck dissection/parotidectomy/tonsillectomy 02/25/17, Radiation to the Left parotid/ left neck / skull base treated to 60 Gy in 30 fractions, aortic aneurysm under watch currently    Patient Stated Goals  decrease swelling in the face and improve Lt shoulder function if possible    Currently in Pain?  No/denies                  Outpatient Rehab from 08/25/2017  [facility]  Lymphedema Life Impact Scale Total Score  8.82 %           [facility] Adult PT Treatment/Exercise - 09/11/17 0001      Exercises   Exercises  Other Exercises    Other Exercises   from standing , leaning forward with hands on mat and elbows straight, cat/cow with emphasis on scapular excursion and thoracic mobility       Shoulder Exercises: ROM/Strengthening   Other ROM/Strengthening Exercises  standing with one hand behind head and other hand on the wall, rotate upper body backward toward the hand behind head       Manual Therapy   Edema Management  encouraged pt to wear his chip pack for a longer period of time to get a good idea if getting a jovi pak garment will be beneficial to him.  Recommend jovi pak as he has fibrosis in left lateral neck in addition to lymphedema in left cheek and under chin     Manual Lymphatic Drainage (MLD)  In sitting : Short and long neck, bil shoulder collectors, bil axillary nodes, 5 diaphragmatic breaths, anterior and lateral throat, submental nodes, bil masseters, pre-and retroauricular nodes, and suboccipital nodes then retracing all steps and  reviewing pressure with pt throughout and answerin ghis questions regarding technique.      Kinesiotix   Edema  skin kote,  then 4 pronged fan shape going from cheek to posterior shoulder                  PT Long Term Goals - 09/04/17 1636      PT LONG TERM GOAL #1   Title  Pt will be independent with self MLD for the face and neck    Time  6    Period  Weeks    Status  On-going      PT LONG TERM GOAL #2   Title  Pt will obtain correct facial compression garment for home use    Time  6    Period  Weeks    Status  On-going      PT LONG TERM GOAL #3   Title  Pt will decrease facial measurements to: Nose to ear: 11.8 and mouth to ear: 11    Status  On-going      PT LONG TERM GOAL #4   Title  Pt will elevate the Lt shoulder to 120degrees without  abherrant scapular movement due to palsy    Status  On-going      PT LONG TERM GOAL #5   Title  Pt will be ind with HEP for continued Lt shoulder stability and cervical mobility     Status  On-going            Plan - 09/11/17 1412    Clinical Impression Statement  Pt appears to be making improvments in shoulder strenght and lymphedema, though deficits are still evident. He is benfitting from the kinesiotape and will make an effort to wear his chip pack more as I think he would benefit from compression     PT Next Visit Plan  If pt wants to get jovipak online, measure for sizing.  continue MLD face (no abdominal due to aneurysm breathing only), assess if he is doing ok with self MLD.  Reapply kinesiotape if skin in ok. help with getting compression garment if needed    Progress , scapular/RTC strengthening    Consulted and Agree with Plan of Care  Patient       Patient will benefit from skilled therapeutic intervention in order to improve the following deficits and impairments:  Pain, Decreased mobility, Postural dysfunction, Decreased activity tolerance, Decreased range of motion, Decreased strength, Impaired UE functional use, Increased edema  Visit Diagnosis: Abnormal posture  Stiffness of left shoulder, not elsewhere classified  Neck stiffness  Lymphedema, not elsewhere classified     Problem List Patient Active Problem List   Diagnosis Date Noted  . Cancer of skin of face 03/19/2017   Donato Heinz. Owens Shark PT  Norwood Levo 09/11/2017, 2:15 PM  [place], [place], [address] Phone: [phone]   Fax:  [phone]  Name: Timothy Weaver MRN: [id number] Date of Birth: [birth year]

## 2017-09-16 ENCOUNTER — Ambulatory Visit: Payer: Medicare Other

## 2017-09-16 DIAGNOSIS — R293 Abnormal posture: Secondary | ICD-10-CM | POA: Diagnosis not present

## 2017-09-16 DIAGNOSIS — M25612 Stiffness of left shoulder, not elsewhere classified: Secondary | ICD-10-CM

## 2017-09-16 DIAGNOSIS — M436 Torticollis: Secondary | ICD-10-CM

## 2017-09-16 DIAGNOSIS — I890 Lymphedema, not elsewhere classified: Secondary | ICD-10-CM

## 2017-09-16 NOTE — Patient Instructions (Signed)
3 Way Raises:      Starting Position:  Leaning against wall, walk feet a few inches away from the wall and make tummy tight (tuck hips underneath you) Press back/shoulders/head against wall as much as possible. Keep thumbs up to ceiling, elbows straight and shoulders relaxed/down throughout.  1. Lift arms in front to shoulder height 2. Lift arms a little wider into a "V" to shoulder height 3. Lift arms out to sides in a "T" to shoulder height  Perform 10 times in each direction. Hold 1-2 lbs to start with and work up to 2-3 sets of 10/day. Perform 3-4 times/week. Increase weight as able, decreasing sets of 10 each time you increase weights, then slowly working your way back up to 2-3 sets each time.     Overhead Dumbbell Press    Standing in same position as above and holding _2-3__ lb dumbbells. Press dumbbells above head. Keep palms facing forward. Do _1-2__ sets of _10__ repetitions. Advanced: Do alternating arms.   Also do wall push ups slowly increasing the incline working towards counter top, then chair, then floor if able.   Keep elbows at your sides to feel shoulder blades coming together in back.    Cancer Rehab [phone]

## 2017-09-16 NOTE — Therapy (Signed)
[place], [place], [address] Phone: [phone]   Fax:  [phone]  Physical Therapy Treatment  Patient Details  Name: RAYMIR FROMMELT MRN: [id number] Date of Birth: [birth year] Referring Provider: Dr. Eppie Gibson   Encounter Date: 09/16/2017  PT End of Session - 09/16/17 1520    Visit Number  6    Number of Visits  12    Date for PT Re-Evaluation  10/06/17    PT Start Time  1430    PT Stop Time  1521    PT Time Calculation (min)  51 min    Activity Tolerance  Patient tolerated treatment well    Behavior During Therapy  [facility] for tasks assessed/performed       Past Medical History:  Diagnosis Date  . Facial abscess 01/08/2017  . History of radiation therapy 04/10/17- 05/21/17   Left Parotid treated to 60 Gy with 30 fractions.   . Shingles 2004  . 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 auriculectomy Left 02/25/2017   Dr. [name]- [facility]   . hearing loss    . MOUTH SURGERY    . NECK DISSECTION Left 02/25/2017   Dr. [name], [facility].   Marland Kitchen PAROTIDECTOMY Left 02/25/2017   Dr. [name] [facility]  . TONSILLECTOMY    . WISDOM TOOTH EXTRACTION      There were no vitals filed for this visit.  Subjective Assessment - 09/16/17 1442    Subjective  The last few times we've done the tape the fullness starts building up before it's time to take it off. Started more consistently doing the massage over the weekend. I want to order the [facility], just haven't gotten around to doing it yet.     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  No/denies         [facility] PT Assessment - 09/16/17 0001      AROM   Left Shoulder Flexion  135 Degrees    Left Shoulder ABduction  118 Degrees   weakness stops him here, with assist 146 degrees       LYMPHEDEMA/ONCOLOGY QUESTIONNAIRE - 09/16/17 1444      Head and Neck   Right Lateral Nostril at base of nose to medial tragus   11.4 cm    Left Lateral Nostril at base of nose to medial tragus   12 cm    Right Corner of mouth to where ear lobe meets face  9.9 cm    Left Corner of mouth to where ear lobe meets face  11.4 cm    Other  10 cm superior to sternal notch: 41.6 cm           [facility]patient [facility]  Lymphedema Life Impact Scale Total Score  8.82 %           [facility] Adult PT Treatment/Exercise - 09/16/17 0001      Shoulder Exercises: Standing   Flexion  Strengthening;Left;5 reps;Theraband   this was very easy so stopped and switched exs   Theraband  Level (Shoulder Flexion)  Level 2 (Red)    Other Standing Exercises  Head, shoulders and back against wall with core engaged for bil UE 3 way raises with 3 lbs for flexion and scaption, then 2 lbs for abduction , 10 times each with VCs not to compensate with Lt upper trap; then [facility] press with 2 lbs within pts available ROM due to weakness x15 each    Other Standing Exercises  Wall push ups x 10 with pt returning therapist demonstration and VCs to keep elbows near sides to decrease compensation and for increased scapular engagement. Spent alot of time educating pt on proper muscle engagment for exercises and A/ROM of Lt shoulder and to avoid compensations. Also in front of mirror for visual cuing.                  PT Long Term Goals - 09/04/17 1636      PT LONG TERM GOAL #1   Title  Pt will be independent with self MLD for the face and neck    Time  6    Period  Weeks    Status  On-going      PT  LONG TERM GOAL #2   Title  Pt will obtain correct facial compression garment for home use    Time  6    Period  Weeks    Status  On-going      PT LONG TERM GOAL #3   Title  Pt will decrease facial measurements to: Nose to ear: 11.8 and mouth to ear: 11    Status  On-going      PT LONG TERM GOAL #4   Title  Pt will elevate the Lt shoulder to 120degrees without abherrant scapular movement due to palsy    Status  On-going      PT LONG TERM GOAL #5   Title  Pt will be ind with HEP for continued Lt shoulder stability and cervical mobility     Status  On-going            Plan - 09/16/17 1645    Clinical Impression Statement  Pts circumference mesaurements continue to improve each time measured. He reports to being very compliant with self MLD and asks appropriate questions regarding his technique which we reviewed briefly. Also he reports wanting to purchase a head/neck Jovi garment and plans to do so in next few days so measured him for this. He requires size medium but with extender strap. Spent rest of session addressing his Lt shoulder weakness and compensatory techniques by progressing HEP. Did not reapply tape today as pt reports last few times this hasn't been beneficial and has actually experienced swelling between the fingers of the tape.     Rehab Potential  Good    Clinical Impairments Affecting Rehab Potential  possible complete nerve loss    PT Frequency  2x / week    PT Duration  6 weeks    PT Treatment/Interventions  Scar mobilization;Neuromuscular re-education;Manual techniques;Taping;Therapeutic exercise;Patient/family education;Manual lymph drainage    PT Next Visit Plan  See if pt ordered a Jovi Pakcontinue MLD face (no abdominal due to aneurysm breathing only), assess if he is doing ok with self MLD.  Progress , scapular/RTC strengthening    Consulted and Agree with Plan of Care  Patient       Patient will benefit from skilled therapeutic intervention in order to  improve the following deficits and impairments:  Pain, Decreased mobility, Postural  dysfunction, Decreased activity tolerance, Decreased range of motion, Decreased strength, Impaired UE functional use, Increased edema  Visit Diagnosis: Abnormal posture  Stiffness of left shoulder, not elsewhere classified  Neck stiffness  Lymphedema, not elsewhere classified     Problem List Patient Active Problem List   Diagnosis Date Noted  . Cancer of skin of face 03/19/2017    Otelia Limes, PTA 09/16/2017, 4:56 PM  [place], [place], [address] Phone: [phone]   Fax:  [phone]  Name: ASHFORD CLOUSE MRN: [id number] Date of Birth: 03-May-1940

## 2017-09-18 ENCOUNTER — Ambulatory Visit: Payer: Medicare Other | Admitting: Physical Therapy

## 2017-09-18 ENCOUNTER — Encounter: Payer: Self-pay | Admitting: Physical Therapy

## 2017-09-18 DIAGNOSIS — I890 Lymphedema, not elsewhere classified: Secondary | ICD-10-CM

## 2017-09-18 DIAGNOSIS — M25612 Stiffness of left shoulder, not elsewhere classified: Secondary | ICD-10-CM

## 2017-09-18 DIAGNOSIS — R293 Abnormal posture: Secondary | ICD-10-CM | POA: Diagnosis not present

## 2017-09-18 DIAGNOSIS — M436 Torticollis: Secondary | ICD-10-CM

## 2017-09-18 NOTE — Therapy (Signed)
[place], [place], [address] Phone: [phone]   Fax:  [phone]  Physical Therapy Treatment  Patient Details  Name: Timothy Weaver MRN: [id number] Date of Birth: 1940-05-06 Referring Provider: Dr. Eppie Gibson   Encounter Date: 09/18/2017  PT End of Session - 09/18/17 1605    Visit Number  7    Number of Visits  12    Date for PT Re-Evaluation  10/06/17    PT Start Time  1440    PT Stop Time  2694   PT started late today    PT Time Calculation (min)  35 min    Activity Tolerance  Patient tolerated treatment well    Behavior During Therapy  [facility] for tasks assessed/performed       Past Medical History:  Diagnosis Date  . Facial abscess 01/08/2017  . History of radiation therapy 04/10/17- 05/21/17   Left Parotid treated to 60 Gy with 30 fractions.   . Shingles 2004  . 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 auriculectomy Left 02/25/2017   Dr. [name]- [facility].   . hearing loss    . MOUTH SURGERY    . NECK DISSECTION Left 02/25/2017   Dr. [name], [facility].   Marland Kitchen PAROTIDECTOMY Left 02/25/2017   Dr. [name] [facility]  . TONSILLECTOMY    . WISDOM TOOTH EXTRACTION      There were no vitals filed for this visit.  Subjective Assessment - 09/18/17 1443    Subjective  Pt  says he is going to get the [place]  He is going to order it today.  He is doing well with his exercises for his shoulders.  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  No/denies                  Outpatient Rehab from [facility]/25/2017 [facility]  Lymphedema Life Impact Scale Total Score  8.82 %           [facility] Adult PT Treatment/Exercise - 09/18/17 0001      Manual Therapy   Manual Lymphatic Drainage (MLD)  In sitting : Short and long neck, bil shoulder collectors, bil axillary nodes, 5 diaphragmatic breaths, anterior and lateral throat, submental nodes, bil masseters, pre-and retroauricular nodes, and suboccipital nodes then retracing all steps and reviewing pressure with pt throughout and answerin ghis questions regarding technique.                  PT Long Term Goals - 09/04/17 1636      PT LONG TERM GOAL #1   Title  Pt will be independent with self MLD for the face and neck    Time  6    Period  Weeks    Status  On-going      PT LONG TERM GOAL #2   Title  Pt will obtain correct facial compression garment for home  use    Time  6    Period  Weeks    Status  On-going      PT LONG TERM GOAL #3   Title  Pt will decrease facial measurements to: Nose to ear: 11.8 and mouth to ear: 11    Status  On-going      PT LONG TERM GOAL #4   Title  Pt will elevate the Lt shoulder to 120degrees without abherrant scapular movement due to palsy    Status  On-going      PT LONG TERM GOAL #5   Title  Pt will be ind with HEP for continued Lt shoulder stability and cervical mobility     Status  On-going            Plan - 09/18/17 1606    Clinical Impression Statement  Treatment focused on MLD today as pt feels that he is doing ok with shoulder execises but is not getting the results with self MLD He has decided to order the Jovi pak garment     Rehab Potential  Good    Clinical Impairments Affecting Rehab Potential  possible complete nerve loss    PT Frequency  2x / week    PT Duration  6 weeks    PT Next Visit Plan  See if pt  ordered a Jovi Pak and assess fit and modify when it arrives. continue MLD face (no abdominal due to aneurysm breathing only), assess if he is doing ok with self MLD.  conitnue with MLD Progress , scapular/RTC strengthening    Consulted and Agree with Plan of Care  Patient       Patient will benefit from skilled therapeutic intervention in order to improve the following deficits and impairments:  Pain, Decreased mobility, Postural dysfunction, Decreased activity tolerance, Decreased range of motion, Decreased strength, Impaired UE functional use, Increased edema  Visit Diagnosis: Abnormal posture  Stiffness of left shoulder, not elsewhere classified  Neck stiffness  Lymphedema, not elsewhere classified     Problem List Patient Active Problem List   Diagnosis Date Noted  . Cancer of skin of face 03/19/2017   Donato Heinz. Owens Shark PT  Norwood Levo 09/18/2017, 4:09 PM  [place], [place], [address] Phone: [phone]   Fax:  [phone]  Name: Timothy Weaver MRN: [id number] Date of Birth: 01/09/1941

## 2017-10-01 DIAGNOSIS — C44300 Unspecified malignant neoplasm of skin of unspecified part of face: Secondary | ICD-10-CM | POA: Diagnosis not present

## 2017-10-07 ENCOUNTER — Encounter: Payer: Self-pay | Admitting: Thoracic Surgery (Cardiothoracic Vascular Surgery)

## 2017-10-14 ENCOUNTER — Ambulatory Visit: Payer: Medicare Other | Attending: Radiation Oncology | Admitting: Physical Therapy

## 2017-10-14 ENCOUNTER — Encounter: Payer: Self-pay | Admitting: Physical Therapy

## 2017-10-14 DIAGNOSIS — M436 Torticollis: Secondary | ICD-10-CM | POA: Diagnosis not present

## 2017-10-14 DIAGNOSIS — I890 Lymphedema, not elsewhere classified: Secondary | ICD-10-CM | POA: Insufficient documentation

## 2017-10-14 DIAGNOSIS — R293 Abnormal posture: Secondary | ICD-10-CM | POA: Diagnosis not present

## 2017-10-14 DIAGNOSIS — M25612 Stiffness of left shoulder, not elsewhere classified: Secondary | ICD-10-CM | POA: Insufficient documentation

## 2017-10-14 NOTE — Therapy (Signed)
[place], [place], [address] Phone: [phone]   Fax:  [phone]  Physical Therapy Treatment  Patient Details  Name: Timothy Weaver MRN: [id number] Date of Birth: 1940/07/06 Referring Provider: Dr. Eppie Gibson    Encounter Date: 10/14/2017  PT End of Session - 10/14/17 1641    Visit Number  8    Number of Visits  16    Date for PT Re-Evaluation  11/13/17    PT Start Time  1300    PT Stop Time  1345    PT Time Calculation (min)  45 min    Activity Tolerance  Patient tolerated treatment well    Behavior During Therapy  [facility] for tasks assessed/performed       Past Medical History:  Diagnosis Date  . Facial abscess 01/08/2017  . History of radiation therapy 04/10/17- 05/21/17   Left Parotid treated to 60 Gy with 30 fractions.   . Shingles 2004  . 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 auriculectomy Left 02/25/2017   Dr. [name]- [facility].   . hearing loss    . MOUTH SURGERY    . NECK DISSECTION Left 02/25/2017   Dr. [name], [facility].   Marland Kitchen PAROTIDECTOMY Left 02/25/2017   Dr. [name] [facility]  . TONSILLECTOMY    . WISDOM TOOTH EXTRACTION      There were no vitals filed for this visit.  Subjective Assessment - 10/14/17 1630    Subjective  Pt has looked into ordering the [place] but wants to find a less expensive option.  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  No/denies         [facility] PT Assessment - 10/14/17 0001      Assessment   Medical Diagnosis  L Cutaneous Carcinoma of the Head and Neck    Referring Provider  Dr. Eppie Gibson     Onset Date/Surgical Date  02/25/17      Prior Function   Level of Independence  Independent      AROM   Left Shoulder Flexion  135 Degrees    Left Shoulder ABduction  120 Degrees   pt has to adjust scapular position       LYMPHEDEMA/ONCOLOGY QUESTIONNAIRE - 10/14/17 1322      Head and Neck   Right Lateral Nostril at base of nose to medial tragus   10.5 cm    Left Lateral Nostril at base of nose to medial tragus   11.5 cm    Right Corner of mouth to where ear lobe meets face  10.5 cm    Left Corner of mouth to where ear lobe meets face  11 cm           Outpatient Rehab from 08/25/2017 in [place]  Lymphedema Life Impact Scale Total Score  8.82 %           [facility] Adult PT Treatment/Exercise - 10/14/17 0001  Manual Therapy   Edema Management  showed pt other options for garments and he will order the [facility] medium coverage with full neck compression garment      Manual Lymphatic Drainage (MLD)  In sitting : Short and long neck, bil shoulder collectors, bil axillary nodes, 5 diaphragmatic breaths, anterior and lateral throat, submental nodes, bil masseters, pre-and retroauricular nodes, and suboccipital nodes then retracing all steps and reviewing pressure with pt throughout and answerin ghis questions regarding technique.                  PT Long Term Goals - 10/14/17 1644      PT LONG TERM GOAL #1   Title  Pt will be independent with self MLD for the face and neck    Time  4    Period  Weeks    Status  On-going      PT LONG TERM GOAL #2   Title  Pt will obtain correct facial compression garment for home use    Time  4    Period  Weeks    Status  On-going      PT LONG TERM GOAL #3   Title  Pt will decrease facial measurements to: Nose  to ear: 11.8 and mouth to ear: 11    Time  4    Period  Weeks    Status  On-going      PT LONG TERM GOAL #4   Title  Pt will elevate the Lt shoulder to 120degrees without abherrant scapular movement due to palsy    Time  4    Status  On-going      PT LONG TERM GOAL #5   Title  Pt will be ind with HEP for continued Lt shoulder stability and cervical mobility     Status  Achieved            Plan - 10/14/17 1641    Clinical Impression Statement  Pt continues to have lymphedema though he says he is trying to do MLD.  He feels that his arm is stronger but still has AROM deficits.  Gave him a flyer to [place] [employer] [place] [employer] [place] ( his wife goes there).  He will order a more cost effective garment and will come back once he gets if for fit and for [place] to give him foam reinforcement to wear inside it if needed.  Recert sent today     Clinical Impairments Affecting Rehab Potential  possible complete nerve loss    PT Frequency  2x / week    PT Duration  6 weeks    PT Next Visit Plan  assess garment and add foam if needed progress with exercise if pt wants to or discharge     Consulted and Agree with Plan of Care  Patient       Patient will benefit from skilled therapeutic intervention in order to improve the following deficits and impairments:  Pain, Decreased mobility, Postural dysfunction, Decreased activity tolerance, Decreased range of motion, Decreased strength, Impaired UE functional use, Increased edema  Visit Diagnosis: Abnormal posture - Plan: PT plan of care cert/re-cert  Stiffness of left shoulder, not elsewhere classified - Plan: PT plan of care cert/re-cert  Neck stiffness - Plan: PT plan of care cert/re-cert  Lymphedema, not elsewhere classified - Plan: PT plan of care cert/re-cert     Problem List Patient Active Problem List   Diagnosis Date Noted  .  Cancer of skin of face 03/19/2017   Donato Heinz. Owens Shark PT  [place] Levo 10/14/2017, 4:49  PM  [place], [place], [address] Phone: [phone]   Fax:  [phone]  Name: Timothy Weaver MRN: [id number] Date of Birth: [birth year]

## 2017-10-16 ENCOUNTER — Encounter: Payer: Self-pay | Admitting: Physical Therapy

## 2017-10-21 ENCOUNTER — Encounter: Payer: Self-pay | Admitting: Physical Therapy

## 2017-10-22 ENCOUNTER — Encounter: Payer: Self-pay | Admitting: Physical Therapy

## 2017-10-22 ENCOUNTER — Ambulatory Visit: Payer: Medicare Other | Admitting: Physical Therapy

## 2017-10-22 DIAGNOSIS — L570 Actinic keratosis: Secondary | ICD-10-CM | POA: Diagnosis not present

## 2017-10-22 DIAGNOSIS — M25612 Stiffness of left shoulder, not elsewhere classified: Secondary | ICD-10-CM

## 2017-10-22 DIAGNOSIS — I890 Lymphedema, not elsewhere classified: Secondary | ICD-10-CM

## 2017-10-22 DIAGNOSIS — R293 Abnormal posture: Secondary | ICD-10-CM

## 2017-10-22 DIAGNOSIS — M436 Torticollis: Secondary | ICD-10-CM

## 2017-10-22 DIAGNOSIS — Z85828 Personal history of other malignant neoplasm of skin: Secondary | ICD-10-CM | POA: Diagnosis not present

## 2017-10-22 DIAGNOSIS — D045 Carcinoma in situ of skin of trunk: Secondary | ICD-10-CM | POA: Diagnosis not present

## 2017-10-22 DIAGNOSIS — L980 Pyogenic granuloma: Secondary | ICD-10-CM | POA: Diagnosis not present

## 2017-10-22 DIAGNOSIS — D0439 Carcinoma in situ of skin of other parts of face: Secondary | ICD-10-CM | POA: Diagnosis not present

## 2017-10-22 NOTE — Therapy (Signed)
[place], [place], [address] Phone: [phone]   Fax:  [phone]  Physical Therapy Treatment  Patient Details  Name: Timothy Weaver MRN: [id number] Date of Birth: 01/08/1941 Referring Provider: Dr. Eppie Gibson    Encounter Date: 10/22/2017  PT End of Session - 10/22/17 1600    Visit Number  9    Number of Visits  16    Date for PT Re-Evaluation  11/13/17    PT Start Time  9024    PT Stop Time  1555    PT Time Calculation (min)  40 min    Activity Tolerance  Patient tolerated treatment well    Behavior During Therapy  [facility] for tasks assessed/performed       Past Medical History:  Diagnosis Date  . Facial abscess 01/08/2017  . History of radiation therapy 04/10/17- 05/21/17   Left Parotid treated to 60 Gy with 30 fractions.   . Shingles 2004  . 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 auriculectomy Left 02/25/2017   Dr. [name]- [facility] [address]   . hearing loss    . MOUTH SURGERY    . NECK DISSECTION Left 02/25/2017   Dr. [name], [facility].   Marland Kitchen PAROTIDECTOMY Left 02/25/2017   Dr. [name] [facility]  . TONSILLECTOMY    . WISDOM TOOTH EXTRACTION      There were no vitals filed for this visit.  Subjective Assessment - 10/22/17 1556    Subjective  "I want to make sure I am putting this thing on right"  He says he is doing manual lymph drainage some of the time but is doing his exercises daily for his left shoulder.  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         LYMPHEDEMA/ONCOLOGY QUESTIONNAIRE - 10/22/17 1547      Head and Neck   Left Lateral Nostril at base of nose to medial tragus   11 cm    Left Corner of mouth to where ear lobe meets face  11 cm           Outpatient Rehab from 08/25/2017 in [place]  Lymphedema Life Impact Scale Total Score  8.82 %           [facility] Adult PT Treatment/Exercise - 10/22/17 0001      Manual Therapy   Manual Therapy  Edema management    Edema Management  pt comes in with his [employer] compression garment.  Worked with him on application of garment and gave him 2 pieces of small dotted form to wear at neck and at  cheek. He is able to  apply the garment and position the foam indpendently.              PT Education - 10/22/17 1559    Education Details  application of neck compression garment     Person(s) Educated  Patient    Methods  Explanation;Demonstration;Tactile cues;Verbal cues    Comprehension  Returned demonstration          PT Long Term Goals - 10/22/17 1544      PT LONG TERM GOAL #1   Title  Pt will be independent with self MLD for the face and neck    Status  Achieved      PT LONG TERM GOAL #2   Title  Pt will obtain correct facial compression garment for home use    Status  Achieved      PT LONG TERM GOAL #3   Title  Pt will decrease facial measurements to: Nose to ear: 11.8 and mouth to ear: 11    Status  Achieved      PT LONG TERM GOAL #4   Title  Pt will elevate the Lt shoulder to 120degrees without abherrant scapular movement due to palsy    Baseline  --    Status  Achieved      PT LONG TERM GOAL #5   Title  Pt will be ind  with HEP for continued Lt shoulder stability and cervical mobility     Status  Achieved            Plan - 10/22/17 1600    Clinical Impression Statement  Pt brings in the [facility] compression garment that he bought online.  It fits him and is able to provide the compression where he needs it.  Small dotted peach foam added to wear inside the garment for even more compression to full areas at neck and at left cheek.  Pt is able to apply it himself.  He is following through with exercises a home and MLD as his can although he says he is trying to do it more often. Overall, he is making improvements and is ready to discharge from this episode     Rehab Potential  Good    PT Next Visit Plan  discharge this episode     Consulted and Agree with Plan of Care  Patient       Patient will benefit from skilled therapeutic intervention in order to improve the following deficits and impairments:  Pain, Decreased mobility, Postural dysfunction, Decreased activity tolerance, Decreased range of motion, Decreased strength, Impaired UE functional use, Increased edema  Visit Diagnosis: Abnormal posture  Stiffness of left shoulder, not elsewhere classified  Neck stiffness  Lymphedema, not elsewhere classified     Problem List Patient Active Problem List   Diagnosis Date Noted  . Cancer of skin of face 03/19/2017   PHYSICAL THERAPY DISCHARGE SUMMARY  Visits from Start of Care: 9   Current functional level related to goals / functional outcomes: Pt is independent in self care   Remaining deficits: Lymphedema in neck, decreased range of motion and strength of left shoulder    Education / Equipment: Home exercise, lymphedema management  Plan: Patient agrees to discharge.  Patient goals were met. Patient is being discharged due to being pleased with the current functional level.  ?????  Donato Heinz. Owens Shark PT   Norwood Levo 10/22/2017, 4:05 PM  [place], [place], [address] Phone: [phone]  Fax:  [phone]  Name: JAMARCO ZALDIVAR MRN: [id number] Date of Birth: 1940/03/15

## 2017-10-23 ENCOUNTER — Encounter: Payer: Self-pay | Admitting: Physical Therapy

## 2017-10-28 ENCOUNTER — Institutional Professional Consult (permissible substitution): Payer: Medicare Other | Admitting: Thoracic Surgery (Cardiothoracic Vascular Surgery)

## 2017-10-28 ENCOUNTER — Encounter: Payer: Self-pay | Admitting: Thoracic Surgery (Cardiothoracic Vascular Surgery)

## 2017-10-28 ENCOUNTER — Other Ambulatory Visit: Payer: Self-pay

## 2017-10-28 VITALS — BP 124/85 | HR 72 | Resp 16 | Ht 72.0 in | Wt 178.0 lb

## 2017-10-28 DIAGNOSIS — I712 Thoracic aortic aneurysm, without rupture, unspecified: Secondary | ICD-10-CM

## 2017-10-28 NOTE — Progress Notes (Signed)
PCP is Patient, No Pcp Per Referring Provider is Eppie Gibson, MD  Chief Complaint  Patient presents with  . TAA    new referral for surgical assessment...CT CHEST 08/15/17    HPI: Mr. Berenson is sent for consultation regarding a newly discovered ascending aneurysm.  Mr. 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  He had a CT of the chest back in February.  His ascending aorta was noted to be 3.9 cm on that scan.  More recently had a scan in July.  His a sending aorta was measured at 4.3 cm.  He has not had any chest pain, pressure, or tightness.  He does not have any history of aneurysms or sudden death in his family.  He does have a family history of coronary disease.   Past Medical History:  Diagnosis Date  . Facial abscess 01/08/2017  . History of radiation therapy 04/10/17- 05/21/17   Left Parotid treated to 60 Gy with 30 fractions.   . Shingles 2004  . Squamous cell carcinoma of parotid ([place]) 02/11/2017    Past Surgical History:  Procedure Laterality Date  . auriculectomy Left 02/25/2017   Dr. [name]- [facility].   . hearing loss    . MOUTH SURGERY    . NECK DISSECTION Left 02/25/2017   Dr[name]   Marland Kitchen PAROTIDECTOMY Left 02/25/2017   Dr. [name] [facility]  . TONSILLECTOMY    . WISDOM TOOTH EXTRACTION      No family history on file.  Social History Social History   Tobacco Use  . Smoking status: Never Smoker  . Smokeless tobacco: Never Used  Substance Use Topics  . Alcohol use: Yes    Comment: occasional. not since surgery 02/2017  . Drug use: No    Current Outpatient Medications  Medication Sig Dispense Refill  . acetaminophen (TYLENOL) 500 MG tablet Take 500 mg by mouth every 6 (six) hours as needed.    Marland Kitchen Cod Liver Oil 1000 MG CAPS Take by mouth.    Marland Kitchen ibuprofen (ADVIL,MOTRIN) 400 MG tablet Take 400 mg by mouth every  6 (six) hours as needed.    . lidocaine (XYLOCAINE) 2 % solution Patient: Mix 1part 2% viscous lidocaine, 1part H20. Swish & swallow 26mL of diluted mixture, 106min before meals and at bedtime, up to QID 100 mL 5  . LYSINE PO Take by mouth.    . Multiple Vitamins-Minerals (MULTIVITAMIN WITH MINERALS) tablet Take 1 tablet by mouth daily.    . sodium fluoride (FLUORISHIELD) 1.1 % GEL dental gel Instill one drop of gel per tooth space of fluoride tray. Place over teeth for 5 minutes. Remove. Spit out excess. Repeat nightly. 120 mL 11  . vitamin C (ASCORBIC ACID) 500 MG tablet Take 500 mg by mouth daily.    . vitamin E 200 UNIT capsule Take 200 Units by mouth daily.     No current facility-administered medications for this visit.     No Known Allergies  Review of Systems  Constitutional: Positive for appetite change and unexpected weight change (Lost 20 pounds since January).  HENT: Positive for hearing loss.   Respiratory: Negative for cough and shortness of breath.   Cardiovascular: Negative for chest pain and leg swelling.  Genitourinary: Negative for dysuria.  Musculoskeletal: Positive for arthralgias (Left shoulder).  Neurological: Negative for dizziness, seizures and weakness.  All other systems reviewed and are negative.  BP 124/85 (BP Location: Right Arm, Patient Position: Sitting, Cuff Size: Large)   Pulse 72   Resp 16   Ht 6' (1.829 m)   Wt 178 lb (80.7 kg)   SpO2 97% Comment: ON RA  BMI 24.14 kg/m  Physical Exam  Constitutional: He is oriented to person, place, and time. He appears well-developed and well-nourished. No distress.  HENT:  Mouth/Throat: No oropharyngeal exudate.  Scarring left face and neck  Eyes: Conjunctivae and EOM are normal. No scleral icterus.  Neck: No thyromegaly present.  Well-healed surgical scars  Cardiovascular: Normal rate, regular rhythm, normal heart sounds and intact distal pulses. Exam reveals no gallop and no friction rub.  No murmur  heard. Pulmonary/Chest: Effort normal and breath sounds normal. No respiratory distress. He has no wheezes. He has no rales.  Abdominal: Soft. He exhibits no distension. There is no tenderness.  Lymphadenopathy:    He has no cervical adenopathy.  Neurological: He is alert and oriented to person, place, and time. No cranial nerve deficit. He exhibits normal muscle tone. Coordination normal.  Skin: Skin is warm and dry.  Vitals reviewed.    Diagnostic Tests: CT CHEST WITH CONTRAST  TECHNIQUE: Multidetector CT imaging of the chest was performed during intravenous contrast administration.  CONTRAST:  2mL OMNIPAQUE IOHEXOL 300 MG/ML  SOLN  COMPARISON:  03/25/2017.  FINDINGS: Cardiovascular: Ascending aorta measures 4.3 cm. Coronary artery calcification. Heart size normal. No pericardial effusion.  Mediastinum/Nodes: No pathologically enlarged mediastinal, hilar or axillary lymph nodes. Esophagus is grossly unremarkable.  Lungs/Pleura: Biapical pleuroparenchymal scarring. Lungs are clear. No pleural fluid. Airway is unremarkable.  Upper Abdomen: Visualized portions of the liver, gallbladder, adrenal glands, kidneys, spleen, pancreas, stomach and bowel are grossly unremarkable. No upper abdominal adenopathy.  Musculoskeletal: Degenerative changes in the spine. No worrisome lytic or sclerotic lesions.  IMPRESSION: 1. No findings to explain the patient's pain. No evidence of metastatic disease. 2. Ascending Aortic aneurysm NOS (ICD10-I71.9). Recommend annual imaging followup by CTA or MRA. This recommendation follows 2010 ACCF/AHA/AATS/ACR/ASA/SCA/SCAI/SIR/STS/SVM Guidelines for the Diagnosis and Management of Patients with Thoracic Aortic Disease. Circulation. 2010; 121: [id number]. 3. CT neck dictated separately.   Electronically Signed   By: Lorin Picket M.D.   On: 08/15/2017 14:43 I reviewed the CT images and also compared to the scan from February.   There has been no change interval change between February and July.  There is a borderline enlarged ascending aorta.  Impression: Mr. 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  He had a CT of the chest which showed a borderline ascending aneurysm.  He had a CT in February where the ascending aorta measured 3.9 cm.  On his more recent scan it was reported as 4.3 cm.  Comparing the scans side-by-side I see no interval change.  It is just a matter of where the measurement was taken.  In any event there is no indication for surgery at the present time.  Given the size of the ascending aorta we should continue to follow that make sure there is not an increase.  We will plan to follow-up in 1 year.  No history of hypertension.  Blood pressure today well controlled.  Plan: Return in 1 year with CT angiogram of chest  Melrose Nakayama, MD [facility] [phone]

## 2018-01-13 DIAGNOSIS — H40023 Open angle with borderline findings, high risk, bilateral: Secondary | ICD-10-CM | POA: Diagnosis not present

## 2018-01-15 DIAGNOSIS — D0471 Carcinoma in situ of skin of right lower limb, including hip: Secondary | ICD-10-CM | POA: Diagnosis not present

## 2018-01-15 DIAGNOSIS — L821 Other seborrheic keratosis: Secondary | ICD-10-CM | POA: Diagnosis not present

## 2018-01-15 DIAGNOSIS — D045 Carcinoma in situ of skin of trunk: Secondary | ICD-10-CM | POA: Diagnosis not present

## 2018-01-15 DIAGNOSIS — L570 Actinic keratosis: Secondary | ICD-10-CM | POA: Diagnosis not present

## 2018-01-15 DIAGNOSIS — Z85828 Personal history of other malignant neoplasm of skin: Secondary | ICD-10-CM | POA: Diagnosis not present

## 2018-01-15 DIAGNOSIS — D225 Melanocytic nevi of trunk: Secondary | ICD-10-CM | POA: Diagnosis not present

## 2018-01-20 NOTE — Progress Notes (Addendum)
Mr. Krol presents for follow up of radiation completed 05/21/17 to his left parotid/ left neck/ skull base.   Pain issues, if any: He denies.  Using a feeding tube?: N/A Weight changes, if any:  Wt Readings from Last 3 Encounters:  01/23/18 185 lb (83.9 kg)  10/28/17 178 lb (80.7 kg)  08/19/17 181 lb (82.1 kg)   Swallowing issues, if any: He denies.  Smoking or chewing tobacco? No Using fluoride trays daily? He is using them about once a week. He is brushing with baking soda.  Last ENT visit was on: Dr. Conley Canal 10/01/17 next 03/30/18. Other notable issues, if any:  He tells me that he is participating in PT for lymphedema and decreased left arm mobility. He feels like he is improving.  He reports slight numbness, tingling, and sensitivity to his previous radiation site.  He reports decreased hearing that persists to his left ear.   BP (!) 142/92 (BP Location: Left Arm, Patient Position: Sitting)   Pulse 70   Temp 98.4 F (36.9 C) (Oral)   Resp 20   Ht 6' (1.829 m)   Wt 185 lb (83.9 kg)   SpO2 100%   BMI 25.09 kg/m

## 2018-01-23 ENCOUNTER — Encounter: Payer: Self-pay | Admitting: Radiation Oncology

## 2018-01-23 ENCOUNTER — Other Ambulatory Visit: Payer: Self-pay

## 2018-01-23 ENCOUNTER — Ambulatory Visit
Admission: RE | Admit: 2018-01-23 | Discharge: 2018-01-23 | Disposition: A | Discharge status: Home / Self Care | Payer: Medicare Other | Source: Ambulatory Visit | Attending: Radiation Oncology | Admitting: Radiation Oncology

## 2018-01-23 DIAGNOSIS — Z08 Encounter for follow-up examination after completed treatment for malignant neoplasm: Secondary | ICD-10-CM | POA: Diagnosis not present

## 2018-01-23 DIAGNOSIS — I712 Thoracic aortic aneurysm, without rupture: Secondary | ICD-10-CM | POA: Diagnosis not present

## 2018-01-23 DIAGNOSIS — Z923 Personal history of irradiation: Secondary | ICD-10-CM | POA: Insufficient documentation

## 2018-01-23 DIAGNOSIS — Z79899 Other long term (current) drug therapy: Secondary | ICD-10-CM | POA: Insufficient documentation

## 2018-01-23 DIAGNOSIS — I890 Lymphedema, not elsewhere classified: Secondary | ICD-10-CM | POA: Diagnosis not present

## 2018-01-23 DIAGNOSIS — C44300 Unspecified malignant neoplasm of skin of unspecified part of face: Secondary | ICD-10-CM | POA: Diagnosis not present

## 2018-01-23 NOTE — Progress Notes (Signed)
Radiation Oncology         [phone] ________________________________  Name: Timothy Weaver MRN: [id number]  Date: 01/23/2018  DOB: 1940/11/22  Follow-Up Visit Note  CC: Patient, No Pcp Per  [name], *  Diagnosis and Prior Radiotherapy:       ICD-10-CM   1. 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  Narrative:  The patient returns today for routine follow-up of radiation completed on 05/21/2017. He notes that he has been doing well and he has been noticing improvements since treatments.   Using fluoride trays daily? He is using them about once a week. Knows to do this more.  Sees his dentist.  He is brushing with baking soda.  Last ENT visit was on: Dr. Conley Canal 10/01/17 next 03/30/18.  He still has sensitivity from his left ear to his left lower jaw in the surgical bed, however, it is slowly feeling normal. He still notes mild lymphedema to the left cheek and jaw and has been following up with Clarene Critchley. His left arm mobility has vastly improved through the use of Physical Therapy. He still notes increased dry mouth. He recently went to the dentist and was switched to a baking soda type toothpaste along with the advise to swish baking soda and water. He was advised that he should use his fluoride trays daily, which he isn't doing at this time. He notes that his hearing to his left ear is still decreased. He had his hearing  checked prior to surgery, however, he hasn't had his hearing checked since radiation therapy.   He was evaluated  due to an incidental aneurysm found on CT scan - reassured. He has seen his dermatologist who has been evaluating his skin and he has had several biopsies that were found to be squamous cell. He was advised by his dermatologist to use fluorouracil cream for 2 weeks BID.    Weight changes, if any:  Wt Readings from Last 3 Encounters:  01/23/18 185 lb (83.9 kg)  10/28/17 178 lb (80.7 kg)  08/19/17 181 lb (82.1 kg)    ALLERGIES:  has No Known Allergies.  Meds: Current Outpatient Medications  Medication Sig Dispense Refill  . acetaminophen (TYLENOL) 500 MG tablet Take 500 mg by mouth every 6 (six) hours as needed.    Marland Kitchen Cod Liver Oil 1000 MG CAPS Take by mouth.    Marland Kitchen ibuprofen (ADVIL,MOTRIN) 400 MG tablet Take 400 mg by mouth every 6 (six) hours as needed.    Marland Kitchen LYSINE PO Take by mouth.    . Multiple Vitamins-Minerals (MULTIVITAMIN WITH MINERALS) tablet Take 1 tablet by mouth daily.    . sodium fluoride (FLUORISHIELD) 1.1 % GEL dental gel Instill one drop of gel per tooth space of fluoride tray. Place over teeth for 5 minutes. Remove. Spit out excess. Repeat nightly. 120 mL 11  . vitamin C (ASCORBIC ACID) 500 MG tablet Take 500  mg by mouth daily.    . vitamin E 200 UNIT capsule Take 200 Units by mouth daily.    Marland Kitchen lidocaine (XYLOCAINE) 2 % solution Patient: Mix 1part 2% viscous lidocaine, 1part H20. Swish & swallow 52mL of diluted mixture, 14min before meals and at bedtime, up to QID (Patient not taking: Reported on 01/23/2018) 100 mL 5   No current facility-administered medications for this encounter.     Physical Findings: Wt Readings from Last 3 Encounters:  01/23/18 185 lb (83.9 kg)  10/28/17 178 lb (80.7 kg)  08/19/17 181 lb (82.1 kg)    height is 6' (1.829 m) and weight is 185 lb (83.9 kg). His oral temperature is 98.4 F (36.9 C). His blood pressure is 142/92  (abnormal) and his pulse is 70. His respiration is 20 and oxygen saturation is 100%. .  General: Alert and oriented, in no acute distress. HEENT: Head is normocephalic. Oral cavity and oropharynx are clear. A lot of cerumen in the left ear canal  Neck: skin has healed really nicely in the radiation fields. No palpable masses in the cervical, supraclavicular, or peri-auricular regions.  Skin: Skin in treatment fields shows satisfactory healing.    Lab Findings: Lab Results  Component Value Date   WBC 6.6 03/25/2017   HGB 14.8 03/25/2017   HCT 45.2 03/25/2017   MCV 90.2 03/25/2017   PLT 270 03/25/2017    Lab Results  Component Value Date   TSH 1.091 03/25/2017    Radiographic Findings: I have personally reviewed his imaging.   No results found.  Impression/Plan:    1) Head and Neck Cancer Status: No evidence of diease. Follow-up with Dr. Conley Canal as scheduled in February, 2020.  Advised that the patient follow up with Dr. Conley Canal for a cerumen removal as well as a  hearing test.   2) Ascending thoracic aneurysm in neck on previous CT: Patient followed up with Cardiothoracic Surgeon, Dr. Roxan Hockey   3) Mild Lymphedema in neck, advised the patient to continue with PT as instructed.   4) Follow-up in June 2020, The patient was encouraged to call with any issues or questions before then.   I spent at least 15 minutes face to face with the patient, over 50% on counseling and care coordination. _____________________________________   Eppie Gibson, MD  This document serves as a record of services personally performed by Eppie Gibson, MD. It was created on her behalf by [facility], a trained medical scribe. The creation of this record is based on the scribe's personal observations and the provider's statements to them. This document has been checked and approved by the attending provider.

## 2018-03-30 DIAGNOSIS — C44300 Unspecified malignant neoplasm of skin of unspecified part of face: Secondary | ICD-10-CM | POA: Diagnosis not present

## 2018-07-07 DIAGNOSIS — H40023 Open angle with borderline findings, high risk, bilateral: Secondary | ICD-10-CM | POA: Diagnosis not present

## 2018-07-16 DIAGNOSIS — D2261 Melanocytic nevi of right upper limb, including shoulder: Secondary | ICD-10-CM | POA: Diagnosis not present

## 2018-07-16 DIAGNOSIS — L814 Other melanin hyperpigmentation: Secondary | ICD-10-CM | POA: Diagnosis not present

## 2018-07-16 DIAGNOSIS — L430 Hypertrophic lichen planus: Secondary | ICD-10-CM | POA: Diagnosis not present

## 2018-07-16 DIAGNOSIS — Z85828 Personal history of other malignant neoplasm of skin: Secondary | ICD-10-CM | POA: Diagnosis not present

## 2018-07-16 DIAGNOSIS — L821 Other seborrheic keratosis: Secondary | ICD-10-CM | POA: Diagnosis not present

## 2018-07-21 ENCOUNTER — Telehealth: Payer: Self-pay | Admitting: *Deleted

## 2018-07-21 NOTE — Telephone Encounter (Signed)
CALLED PATIENT TO ALTER FU DUE TO DR. SQUIRE BEING ON VACATION, RESCHEDULED FOR 08-14-18 @ 10:20 AM , LVM FOR A RETURN CALL

## 2018-07-22 DIAGNOSIS — Z0120 Encounter for dental examination and cleaning without abnormal findings: Secondary | ICD-10-CM | POA: Diagnosis not present

## 2018-07-31 ENCOUNTER — Ambulatory Visit: Payer: Self-pay | Admitting: Radiation Oncology

## 2018-08-05 NOTE — Progress Notes (Signed)
Mr. Hynes presents for follow up of radiation completed 05/21/2017 to his left parotid/ left neck/ skull base  Pain issues, if any: He reports occasional nerve pain to his joints.  Using a feeding tube?: No Weight changes, if any:  Wt Readings from Last 3 Encounters:  08/12/18 188 lb 2 oz (85.3 kg)  01/23/18 185 lb (83.9 kg)  10/28/17 178 lb (80.7 kg)   Swallowing issues, if any: He denies.  Smoking or chewing tobacco? No Using fluoride trays daily? No, he uses a baking soda toothpaste.  Last ENT visit was on: 03/30/18 Dr. Conley Canal at [facility].  Other notable issues, if any:   BP 125/85 (BP Location: Left Arm, Patient Position: Sitting)   Pulse 66   Temp 98.2 F (36.8 C) (Temporal)   Resp 18   Ht 6\' 2"  (1.88 m)   Wt 188 lb 2 oz (85.3 kg)   SpO2 98%   BMI 24.15 kg/m

## 2018-08-12 ENCOUNTER — Ambulatory Visit
Admission: RE | Admit: 2018-08-12 | Discharge: 2018-08-12 | Disposition: A | Discharge status: Home / Self Care | Payer: Medicare Other | Source: Ambulatory Visit | Attending: Radiation Oncology | Admitting: Radiation Oncology

## 2018-08-12 ENCOUNTER — Other Ambulatory Visit: Payer: Self-pay

## 2018-08-12 ENCOUNTER — Encounter: Payer: Self-pay | Admitting: Radiation Oncology

## 2018-08-12 DIAGNOSIS — C44300 Unspecified malignant neoplasm of skin of unspecified part of face: Secondary | ICD-10-CM | POA: Diagnosis not present

## 2018-08-12 DIAGNOSIS — Z08 Encounter for follow-up examination after completed treatment for malignant neoplasm: Secondary | ICD-10-CM | POA: Diagnosis not present

## 2018-08-12 DIAGNOSIS — Z79899 Other long term (current) drug therapy: Secondary | ICD-10-CM | POA: Diagnosis not present

## 2018-08-12 DIAGNOSIS — Z8589 Personal history of malignant neoplasm of other organs and systems: Secondary | ICD-10-CM | POA: Insufficient documentation

## 2018-08-12 DIAGNOSIS — Z923 Personal history of irradiation: Secondary | ICD-10-CM | POA: Insufficient documentation

## 2018-08-12 NOTE — Progress Notes (Signed)
Radiation Oncology         [phone] ________________________________  Name: Timothy Weaver MRN: [id number]  Date: 08/12/2018  DOB: 1940-09-23  Follow-Up Visit Note  CC: Patient, No Pcp Per  [name], *  Diagnosis and Prior Radiotherapy:       ICD-10-CM   1. Cancer of skin of face  C44.300     Skin cancer of the face invading the parotid gland on the left  Cancer Staging Cancer of skin of face Staging form: Cutaneous Carcinoma of the Head and Neck, AJCC 8th Edition - Clinical: Stage III (cT3, cN0, cM0) - Signed by Eppie Gibson, MD on 03/19/2017 +PNI, negative LVSI, moderately differentiated, negative margins  Radiation treatment dates:   04/10/2017 - 05/21/2017 Site/dose:   Left parotid/ left neck / skull base treated to 60 Gy in 30 fractions  CHIEF COMPLAINT:  Here for follow-up and surveillance of his squamous cell cancer of the parotid.  Narrative:  The patient returns today for routine follow-up of radiation completed on 05/21/2017.  He is doing well.  He denies any new lumps or bumps or sensory symptoms in the face or neck.  He was without evidence of disease when he saw Dr. Conley Canal in February.  He is taking special precautions to try to avoid catching COVID-19.  He continues close follow-up with his dermatologist for skin lesions that need to be removed.   Weight changes, if any:  Wt Readings from Last 3 Encounters:  08/12/18 188 lb 2 oz (85.3 kg)  01/23/18 185 lb (83.9 kg)  10/28/17 178 lb (80.7 kg)    ALLERGIES:  has No Known Allergies.  Meds: Current Outpatient Medications  Medication Sig Dispense Refill  . Cod Liver Oil 1000 MG CAPS Take by mouth.    . LYSINE PO Take by mouth.    . Multiple Vitamins-Minerals (MULTIVITAMIN WITH MINERALS) tablet Take 1 tablet by mouth daily.    . vitamin C (ASCORBIC ACID) 500 MG tablet Take 500 mg by mouth daily.    . vitamin E 200 UNIT capsule Take 200 Units by mouth daily.    Marland Kitchen acetaminophen (TYLENOL) 500  MG tablet Take 500 mg by mouth every 6 (six) hours as needed.    Marland Kitchen ibuprofen (ADVIL,MOTRIN) 400 MG tablet Take 400 mg by mouth every 6 (six) hours as needed.    . lidocaine (XYLOCAINE) 2 % solution Patient: Mix 1part 2% viscous lidocaine, 1part H20. Swish & swallow 33mL of diluted mixture, 66min before meals and at bedtime, up to QID (Patient not taking: Reported on 01/23/2018) 100 mL 5  . sodium fluoride (FLUORISHIELD) 1.1 % GEL dental gel Instill one drop of gel per tooth space of fluoride tray. Place over teeth for 5 minutes. Remove. Spit out excess. Repeat nightly. (Patient not taking: Reported on 08/12/2018) 120 mL 11   No current facility-administered medications for this encounter.     Physical Findings: Wt Readings from Last 3 Encounters:  08/12/18 188 lb 2 oz (85.3 kg)  01/23/18 185 lb (83.9 kg)  10/28/17 178 lb (80.7 kg)    height is 6\' 2"  (1.88 m) and weight is 188 lb 2 oz (85.3 kg). His temporal temperature is 98.2 F (36.8 C). His blood pressure is 125/85 and his pulse is 66. His respiration is 18 and oxygen saturation is 98%. .  General: Alert and oriented, in no acute distress. HEENT: Head is normocephalic.  Mild lymphedema of the left cheek Neck: skin has healed really nicely in  the radiation fields. No palpable masses in the cervical, supraclavicular, or peri-auricular regions.  Skin: Skin in treatment fields shows satisfactory healing.    Lab Findings: Lab Results  Component Value Date   WBC 6.6 03/25/2017   HGB 14.8 03/25/2017   HCT 45.2 03/25/2017   MCV 90.2 03/25/2017   PLT 270 03/25/2017    Lab Results  Component Value Date   TSH 1.091 03/25/2017    Radiographic Findings: I have personally reviewed his imaging.   No results found.  Impression/Plan:    1) Head and Neck Cancer Status: No evidence of diease. Follow-up with Dr. Davene Costain suggested he move out that follow-up to 3 months from now.  Continue close follow-up with dermatology as well   2)  Follow-up in 6 mo with me. He patient was encouraged to call with any issues or questions before then.   I spent at least 20 minutes face to face with the patient, over 50% on counseling and care coordination. _____________________________________   Eppie Gibson, MD  This document serves as a record of services personally performed by Eppie Gibson, MD. It was created on her behalf by [facility], a trained medical scribe. The creation of this record is based on the scribe's personal observations and the provider's statements to them. This document has been checked and approved by the attending provider.

## 2018-08-13 ENCOUNTER — Telehealth: Payer: Self-pay | Admitting: *Deleted

## 2018-08-13 NOTE — Telephone Encounter (Signed)
CALLED PATIENT TO INFORM OF FU APPT. FOR 03-05-19 @ 2 PM , SPOKE WITH PATIENT AND HE IS AWARE OF THIS APPT.

## 2018-08-14 ENCOUNTER — Ambulatory Visit: Payer: Medicare Other | Admitting: Radiation Oncology

## 2018-09-09 ENCOUNTER — Other Ambulatory Visit: Payer: Self-pay | Admitting: Thoracic Surgery (Cardiothoracic Vascular Surgery)

## 2018-09-11 ENCOUNTER — Other Ambulatory Visit: Payer: Self-pay | Admitting: Thoracic Surgery (Cardiothoracic Vascular Surgery)

## 2018-09-11 DIAGNOSIS — I712 Thoracic aortic aneurysm, without rupture, unspecified: Secondary | ICD-10-CM

## 2018-09-28 DIAGNOSIS — C44300 Unspecified malignant neoplasm of skin of unspecified part of face: Secondary | ICD-10-CM | POA: Diagnosis not present

## 2018-09-28 DIAGNOSIS — Z483 Aftercare following surgery for neoplasm: Secondary | ICD-10-CM | POA: Diagnosis not present

## 2018-10-29 ENCOUNTER — Ambulatory Visit
Admission: RE | Admit: 2018-10-29 | Discharge: 2018-10-29 | Disposition: A | Discharge status: Home / Self Care | Payer: Medicare Other | Source: Ambulatory Visit | Attending: Thoracic Surgery (Cardiothoracic Vascular Surgery) | Admitting: Thoracic Surgery (Cardiothoracic Vascular Surgery)

## 2018-10-29 DIAGNOSIS — I712 Thoracic aortic aneurysm, without rupture, unspecified: Secondary | ICD-10-CM

## 2018-10-29 MED ORDER — IOPAMIDOL (ISOVUE-370) INJECTION 76%
75.0000 mL | Freq: Once | INTRAVENOUS | Status: AC | PRN
  Administered 2018-10-29: 13:00:00 75 mL via INTRAVENOUS

## 2018-11-03 ENCOUNTER — Encounter: Payer: Self-pay | Admitting: Thoracic Surgery (Cardiothoracic Vascular Surgery)

## 2018-11-03 ENCOUNTER — Other Ambulatory Visit: Payer: Self-pay

## 2018-11-03 ENCOUNTER — Ambulatory Visit (INDEPENDENT_AMBULATORY_CARE_PROVIDER_SITE_OTHER): Payer: Medicare Other | Admitting: Thoracic Surgery (Cardiothoracic Vascular Surgery)

## 2018-11-03 ENCOUNTER — Other Ambulatory Visit: Payer: Medicare Other

## 2018-11-03 VITALS — BP 145/90 | HR 60 | Temp 97.7°F | Resp 20 | Ht 72.0 in | Wt 190.0 lb

## 2018-11-03 DIAGNOSIS — I712 Thoracic aortic aneurysm, without rupture, unspecified: Secondary | ICD-10-CM

## 2018-11-03 NOTE — Progress Notes (Signed)
[address]       [place] [address]             [phone]    HPI: Mr. 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  He had a CT of his chest in February 2019 as part of his work-up for his parotid gland cancer.  He was noted to have a 3.9 cm ascending aorta.  A scan in July 2019 reported a 4.3 cm ascending aneurysm.  Comparing the films side-by-side there is not appear to be any appreciable difference in the aorta measured between 4 and 4.1 cm.  He has been doing well.  He is not having any chest pain, pressure, or tightness.  He does not have any unusual shortness of breath.  Past Medical History:  Diagnosis Date  . Facial abscess 01/08/2017  . History of radiation therapy 04/10/17- 05/21/17   Left Parotid treated to 60 Gy with 30 fractions.   . Shingles 2004  . Squamous cell carcinoma of parotid ([place]) 02/11/2017    Current Outpatient Medications  Medication Sig Dispense Refill  . acetaminophen (TYLENOL) 500 MG tablet Take 500 mg by mouth every 6 (six) hours as needed.    Marland Kitchen Cod Liver Oil 1000 MG CAPS Take by mouth daily as needed.     Marland Kitchen ibuprofen (ADVIL,MOTRIN) 400 MG tablet Take 400 mg by mouth every 6 (six) hours as needed.    . Multiple Vitamins-Minerals (MULTIVITAMIN WITH MINERALS) tablet Take 1 tablet by mouth as needed.     . vitamin C (ASCORBIC ACID) 500 MG tablet Take 500 mg by mouth as needed.     . vitamin E 200 UNIT capsule Take 200 Units by mouth as needed.      No current facility-administered medications for this visit.     Physical Exam BP (!) 145/90   Pulse 60   Temp 97.7 F (36.5 C) (Skin)   Resp 20   Ht 6' (1.829 m)   Wt 190 lb (86.2 kg)   SpO2 92% Comment: RA  BMI 25.28 kg/m  78 year old man in no acute distress Alert and oriented x3 with no focal deficits Lungs clear bilaterally No  carotid bruits Cardiac regular rate and rhythm normal S1 and S2, no murmur No peripheral edema  Diagnostic Tests: CT ANGIOGRAPHY CHEST WITH CONTRAST  TECHNIQUE: Multidetector CT imaging of the chest was performed using the standard protocol during bolus administration of intravenous contrast. Multiplanar CT image reconstructions and MIPs were obtained to evaluate the vascular anatomy.  CONTRAST:  83mL ISOVUE-370 IOPAMIDOL (ISOVUE-370) INJECTION 76%  COMPARISON:  CT dated August 15, 2017 could not be retrieved for comparison. The report is available for review.  FINDINGS: Cardiovascular: Ascending aorta measures 4.1 cm in diameter. There is no dissection. There are no significant atherosclerotic changes involving the thoracic aorta. The heart size is normal. There are no significant coronary artery calcifications.  Mediastinum/Nodes:  --No mediastinal or hilar lymphadenopathy.  --No axillary lymphadenopathy.  --No supraclavicular lymphadenopathy.  --Normal thyroid gland.  --The esophagus is unremarkable  Lungs/Pleura: No pulmonary nodules or masses. No pleural effusion or pneumothorax. No focal airspace consolidation. No focal pleural abnormality.  Upper Abdomen: There is mild narrowing at the origin of the celiac axis without significant poststenotic dilatation.  Musculoskeletal: No chest wall abnormality. No acute or significant osseous  findings.  Review of the MIP images confirms the above findings.  IMPRESSION: 1. Ascending aortic aneurysm measuring 4.1 cm in diameter. No dissection. Recommend annual imaging followup by CTA or MRA. This recommendation follows 2010 ACCF/AHA/AATS/ACR/ASA/SCA/SCAI/SIR/STS/SVM Guidelines for the Diagnosis and Management of Patients with Thoracic Aortic Disease. Circulation. 2010; [id number]. Aortic aneurysm NOS (ICD10-I71.9) 2. Mild narrowing at the origin of the celiac axis without significant poststenotic  dilatation.   Electronically Signed   By: Constance Holster M.D.   On: 10/29/2018 14:34  I personally reviewed the CT images and concur with the findings noted above  Impression: Reyes Kaster is a 78 year old man with a history of parotid cancer.  As part of his work-up for that he had a CT of the chest which showed a 4.1 cm ascending aneurysm.  His aneurysm is unchanged on a follow-up scan in 1 year.  Ascending aortic aneurysm/thoracic aortic atherosclerosis-stable at 4.1 cm.  Needs continued annual follow-up.  No history of hypertension but his blood pressure was elevated at [id number] systolic.  He said multiple doctors visits recently and says the blood pressures vary between 120 and 145.  I recommended that he get a blood pressure cuff from home and check himself a couple of times a week.  He understands the goal systolic is ideally less than 130 but definitely less than 140.  If is persistently elevated above that level he would be medical treatment.  Plan: Check blood pressure at home periodically. Return in 1 year with CT angiogram of chest  Melrose Nakayama, MD [facility] [phone]

## 2018-11-23 DIAGNOSIS — L821 Other seborrheic keratosis: Secondary | ICD-10-CM | POA: Diagnosis not present

## 2018-11-23 DIAGNOSIS — D3617 Benign neoplasm of peripheral nerves and autonomic nervous system of trunk, unspecified: Secondary | ICD-10-CM | POA: Diagnosis not present

## 2018-11-23 DIAGNOSIS — Z85828 Personal history of other malignant neoplasm of skin: Secondary | ICD-10-CM | POA: Diagnosis not present

## 2018-11-23 DIAGNOSIS — L570 Actinic keratosis: Secondary | ICD-10-CM | POA: Diagnosis not present

## 2018-11-23 DIAGNOSIS — C44729 Squamous cell carcinoma of skin of left lower limb, including hip: Secondary | ICD-10-CM | POA: Diagnosis not present

## 2018-12-16 DIAGNOSIS — C44729 Squamous cell carcinoma of skin of left lower limb, including hip: Secondary | ICD-10-CM | POA: Diagnosis not present

## 2018-12-16 DIAGNOSIS — Z85828 Personal history of other malignant neoplasm of skin: Secondary | ICD-10-CM | POA: Diagnosis not present

## 2019-01-15 DIAGNOSIS — Z85828 Personal history of other malignant neoplasm of skin: Secondary | ICD-10-CM | POA: Diagnosis not present

## 2019-01-15 DIAGNOSIS — L821 Other seborrheic keratosis: Secondary | ICD-10-CM | POA: Diagnosis not present

## 2019-01-15 DIAGNOSIS — D1801 Hemangioma of skin and subcutaneous tissue: Secondary | ICD-10-CM | POA: Diagnosis not present

## 2019-01-15 DIAGNOSIS — L814 Other melanin hyperpigmentation: Secondary | ICD-10-CM | POA: Diagnosis not present

## 2019-02-17 DIAGNOSIS — Z0120 Encounter for dental examination and cleaning without abnormal findings: Secondary | ICD-10-CM | POA: Diagnosis not present

## 2019-03-04 NOTE — Progress Notes (Signed)
Mr. Sill presents for follow up of radiation completed 05/21/2017 to his left parotid/left neck/ skull base.   Pain issues, if any: He denies Using a feeding tube?: N/A Weight changes, if any:  Wt Readings from Last 3 Encounters:  03/05/19 190 lb 6.4 oz (86.4 kg)  11/03/18 190 lb (86.2 kg)  08/12/18 188 lb 2 oz (85.3 kg)   Swallowing issues, if any: He denies Smoking or chewing tobacco? No Using fluoride trays daily? He does not use them. He tells me he tries to remember them occasionally. He does brush with fluoride/baking soda toothpaste.  Last ENT visit was on: Dr. Conley Canal 09/28/2018, next apt in 6 months. He will see him again next month.  Other notable issues, if any:   BP 140/90 (BP Location: Left Arm, Patient Position: Sitting, Cuff Size: Normal)   Pulse 70   Temp 97.9 F (36.6 C)   Resp 20   Wt 190 lb 6.4 oz (86.4 kg)   SpO2 100%   BMI 25.82 kg/m

## 2019-03-05 ENCOUNTER — Encounter: Payer: Self-pay | Admitting: Radiation Oncology

## 2019-03-05 ENCOUNTER — Ambulatory Visit
Admission: RE | Admit: 2019-03-05 | Discharge: 2019-03-05 | Disposition: A | Discharge status: Home / Self Care | Payer: Medicare Other | Source: Ambulatory Visit | Attending: Radiation Oncology | Admitting: Radiation Oncology

## 2019-03-05 ENCOUNTER — Other Ambulatory Visit: Payer: Self-pay

## 2019-03-05 DIAGNOSIS — Z8589 Personal history of malignant neoplasm of other organs and systems: Secondary | ICD-10-CM | POA: Insufficient documentation

## 2019-03-05 DIAGNOSIS — Z85828 Personal history of other malignant neoplasm of skin: Secondary | ICD-10-CM | POA: Diagnosis not present

## 2019-03-05 DIAGNOSIS — Z923 Personal history of irradiation: Secondary | ICD-10-CM | POA: Insufficient documentation

## 2019-03-05 DIAGNOSIS — Z08 Encounter for follow-up examination after completed treatment for malignant neoplasm: Secondary | ICD-10-CM | POA: Diagnosis not present

## 2019-03-05 DIAGNOSIS — C44300 Unspecified malignant neoplasm of skin of unspecified part of face: Secondary | ICD-10-CM

## 2019-03-05 HISTORY — DX: Unspecified osteoarthritis, unspecified site: M19.90

## 2019-03-05 NOTE — Progress Notes (Signed)
Radiation Oncology         [phone] ________________________________  Name: Timothy Weaver MRN: [id number]  Date: 03/05/2019  DOB: 21-Apr-1940  Follow-Up Visit Note in person  CC: Patient, No Pcp Per  [name], *  Diagnosis and Prior Radiotherapy:       ICD-10-CM   1. Cancer of skin of face  C44.300     Skin cancer of the face invading the parotid gland on the left  Cancer Staging Cancer of skin of face Staging form: Cutaneous Carcinoma of the Head and Neck, AJCC 8th Edition - Clinical: Stage III (cT3, cN0, cM0) - Signed by Eppie Gibson, MD on 03/19/2017 +PNI, negative LVSI, moderately differentiated, negative margins  Radiation treatment dates:   04/10/2017 - 05/21/2017 Site/dose:   Left parotid/ left neck / skull base treated to 60 Gy in 30 fractions  CHIEF COMPLAINT:  Here for follow-up and surveillance of his squamous cell cancer of the parotid.  Narrative:  The patient returns today for routine follow-up of radiation completed on 05/21/2017.  He is doing well.  He last saw Dr. Conley Canal on 09/28/2018 with no evidence of recurrent cancer.   Weight changes, if any:  Wt Readings from Last 3 Encounters:  03/05/19 190 lb 6.4 oz (86.4 kg)  11/03/18 190 lb (86.2 kg)  08/12/18 188 lb 2 oz (85.3 kg)   Today, he reports a "[place] neck", sagging of the skin at the anterior neck.  Its not severe but it is noticeable. Swallowing issues, if any: He denies Smoking or chewing tobacco? No Using fluoride trays daily? He does not use them. He tells me he tries to remember them occasionally. He does brush with fluoride/baking soda toothpaste.   He recently had Mohs surgery to his shin.   ALLERGIES:  has No Known Allergies.  Meds: Current Outpatient Medications  Medication Sig Dispense Refill  . acetaminophen (TYLENOL) 500 MG tablet Take 500 mg by mouth every 6 (six) hours as needed.    Marland Kitchen Cod Liver Oil 1000 MG CAPS Take by mouth daily as needed.     Marland Kitchen ibuprofen  (ADVIL,MOTRIN) 400 MG tablet Take 400 mg by mouth every 6 (six) hours as needed.    Marland Kitchen L-Lysine 500 MG TABS Take by mouth.    . Multiple Vitamins-Minerals (MULTIVITAMIN WITH MINERALS) tablet Take 1 tablet by mouth as needed.     . vitamin C (ASCORBIC ACID) 500 MG tablet Take 500 mg by mouth as needed.     . vitamin E 200 UNIT capsule Take 200 Units by mouth as needed.      No current facility-administered medications for this encounter.    Physical Findings: Wt Readings from Last 3 Encounters:  03/05/19 190 lb 6.4 oz (86.4 kg)  11/03/18 190 lb (86.2 kg)  08/12/18 188 lb 2 oz (85.3 kg)    weight is 190 lb 6.4 oz (86.4 kg). His temperature is 97.9 F (36.6 C). His blood pressure is 140/90 and his pulse is 70. His respiration is 20 and oxygen saturation is 100%. .  General: Alert and oriented, in no acute distress. HEENT: Head is normocephalic.    Neck: skin has healed well in the radiation fields. No palpable masses in the cervical, supraclavicular, or peri-auricular regions.  Mild sagging of the skin of the anterior neck but no significant edema. Skin: Skin in treatment fields shows satisfactory healing.     Lab Findings: Lab Results  Component Value Date   WBC 6.6 03/25/2017  HGB 14.8 03/25/2017   HCT 45.2 03/25/2017   MCV 90.2 03/25/2017   PLT 270 03/25/2017    Lab Results  Component Value Date   TSH 1.091 03/25/2017    Radiographic Findings: No new imaging  No results found.  Impression/Plan:    1) Head and Neck Cancer Status: No evidence of diease.    2) Follow-up in 1 month with Dr. Conley Canal and in 4 mo with me. He patient was encouraged to call with any issues or questions before then.   3) We discussed measures to reduce the risk of infection during the COVID-19 pandemic.  I gave him advice on how to schedule the vaccine.  On date of service, in total, I spent 30 minutes on this encounter. _____________________________________   Eppie Gibson, MD  This  document serves as a record of services personally performed by Eppie Gibson, MD. It was created on her behalf by Wilburn Mylar, a trained medical scribe. The creation of this record is based on the scribe's personal observations and the provider's statements to them. This document has been checked and approved by the attending provider.

## 2019-05-26 DIAGNOSIS — C44300 Unspecified malignant neoplasm of skin of unspecified part of face: Secondary | ICD-10-CM | POA: Diagnosis not present

## 2019-05-26 DIAGNOSIS — H6122 Impacted cerumen, left ear: Secondary | ICD-10-CM | POA: Diagnosis not present

## 2019-05-26 DIAGNOSIS — Z08 Encounter for follow-up examination after completed treatment for malignant neoplasm: Secondary | ICD-10-CM | POA: Diagnosis not present

## 2019-05-26 DIAGNOSIS — Z85828 Personal history of other malignant neoplasm of skin: Secondary | ICD-10-CM | POA: Diagnosis not present

## 2019-05-31 DIAGNOSIS — H40023 Open angle with borderline findings, high risk, bilateral: Secondary | ICD-10-CM | POA: Diagnosis not present

## 2019-07-08 NOTE — Progress Notes (Signed)
Mr. Rinke presents for follow up of radiation completed on 05/21/2017 to his left parotid/left neck/ skull base.  Pain issues, if any: Patient denies Using a feeding tube?: N/A Weight changes, if any:  Wt Readings from Last 3 Encounters:  07/09/19 192 lb 3.2 oz (87.2 kg)  03/05/19 190 lb 6.4 oz (86.4 kg)  11/03/18 190 lb (86.2 kg)   Swallowing issues, if any: Patient denies Smoking or chewing tobacco? None Using fluoride trays daily? Patient uses on an occasional basis Last ENT visit was on: 05/26/2019 Saw Dr. [name]: "Pertinent findings left ear canal is patent, hard cerumen infection is noted at the level of the tympanic membrane and canal. There are no masses in the neck, facial nerve function is normal, with slight mild marginal weakness on the left. Mr. Greenhaw continues to do very well with no evidence of event cancer. I recommended Debrox for his left ear. I will check him again in 6 months"  Other notable issues, if any: Patient denies. Feels he's doing well  Vitals:   07/09/19 1436  BP: (!) 136/92  Pulse: 72  Resp: 20  Temp: (!) 97.1 F (36.2 C)  SpO2: 98%

## 2019-07-09 ENCOUNTER — Other Ambulatory Visit: Payer: Self-pay

## 2019-07-09 ENCOUNTER — Ambulatory Visit
Admission: RE | Admit: 2019-07-09 | Discharge: 2019-07-09 | Disposition: A | Discharge status: Home / Self Care | Payer: Medicare Other | Source: Ambulatory Visit | Attending: Radiation Oncology | Admitting: Radiation Oncology

## 2019-07-09 ENCOUNTER — Encounter: Payer: Self-pay | Admitting: Radiation Oncology

## 2019-07-09 VITALS — BP 136/92 | HR 72 | Temp 97.1°F | Resp 20 | Ht 72.0 in | Wt 192.2 lb

## 2019-07-09 DIAGNOSIS — Z923 Personal history of irradiation: Secondary | ICD-10-CM | POA: Insufficient documentation

## 2019-07-09 DIAGNOSIS — Z8589 Personal history of malignant neoplasm of other organs and systems: Secondary | ICD-10-CM | POA: Diagnosis not present

## 2019-07-09 DIAGNOSIS — Z08 Encounter for follow-up examination after completed treatment for malignant neoplasm: Secondary | ICD-10-CM | POA: Diagnosis not present

## 2019-07-09 DIAGNOSIS — Z85828 Personal history of other malignant neoplasm of skin: Secondary | ICD-10-CM | POA: Diagnosis not present

## 2019-07-09 DIAGNOSIS — C44300 Unspecified malignant neoplasm of skin of unspecified part of face: Secondary | ICD-10-CM

## 2019-07-12 ENCOUNTER — Other Ambulatory Visit: Payer: Self-pay | Admitting: Radiation Oncology

## 2019-07-12 ENCOUNTER — Encounter: Payer: Self-pay | Admitting: Radiation Oncology

## 2019-07-12 DIAGNOSIS — C44300 Unspecified malignant neoplasm of skin of unspecified part of face: Secondary | ICD-10-CM

## 2019-07-12 NOTE — Progress Notes (Signed)
Radiation Oncology         [phone] ________________________________  Name: Timothy Weaver MRN: [id number]  Date: 07/09/2019  DOB: 05-14-1940  Follow-Up Visit Note in person  CC: Patient, No Pcp Per  Timothy Weaver, *  Diagnosis and Prior Radiotherapy:       ICD-10-CM   1. Cancer of skin of face  C44.300     Skin cancer of the face invading the parotid gland on the left  Cancer Staging Cancer of skin of face Staging form: Cutaneous Carcinoma of the Head and Neck, AJCC 8th Edition - Clinical: Stage III (cT3, cN0, cM0) - Signed by Eppie Gibson, MD on 03/19/2017 +PNI, negative LVSI, moderately differentiated, negative margins  Radiation treatment dates:   04/10/2017 - 05/21/2017 Site/dose:   Left parotid/ left neck / skull base treated to 60 Gy in 30 fractions  CHIEF COMPLAINT:  Here for follow-up and surveillance of his squamous cell cancer of the parotid.  Narrative:    Timothy Weaver presents for follow up of radiation completed on 05/21/2017 to his left parotid/left neck/ skull base.  Pain issues, if any: Patient denies Using a feeding tube?: N/A Weight changes, if any:  Wt Readings from Last 3 Encounters:  07/09/19 192 lb 3.2 oz (87.2 kg)  03/05/19 190 lb 6.4 oz (86.4 kg)  11/03/18 190 lb (86.2 kg)   Swallowing issues, if any: Patient denies Smoking or chewing tobacco? None Using fluoride trays daily? Patient uses on an occasional basis Last ENT visit was on: 05/26/2019 Saw Dr. Fredricka Weaver: "Pertinent findings left ear Weaver is patent, hard cerumen infection is noted at the level of the tympanic membrane and Weaver. There are no masses in the neck, facial nerve function is normal, with slight mild marginal weakness on the left. Timothy Weaver continues to do very well with no evidence of event cancer. I recommended Debrox for his left ear. I will check him again in 6 months"  He has not yet used the Debrox.  Other notable issues, if any: Patient denies.  Feels he's doing well    ALLERGIES:  has No Known Allergies.  Meds: Current Outpatient Medications  Medication Sig Dispense Refill  . acetaminophen (TYLENOL) 500 MG tablet Take 500 mg by mouth every 6 (six) hours as needed.    Marland Kitchen Cod Liver Oil 1000 MG CAPS Take by mouth daily as needed.     Marland Kitchen ibuprofen (ADVIL,MOTRIN) 400 MG tablet Take 400 mg by mouth every 6 (six) hours as needed.    Marland Kitchen L-Lysine 500 MG TABS Take by mouth.    . Multiple Vitamins-Minerals (MULTIVITAMIN WITH MINERALS) tablet Take 1 tablet by mouth as needed.     . vitamin C (ASCORBIC ACID) 500 MG tablet Take 500 mg by mouth as needed.     . vitamin E 200 UNIT capsule Take 200 Units by mouth as needed.      No current facility-administered medications for this encounter.    Physical Findings: Wt Readings from Last 3 Encounters:  07/09/19 192 lb 3.2 oz (87.2 kg)  03/05/19 190 lb 6.4 oz (86.4 kg)  11/03/18 190 lb (86.2 kg)    height is 6' (1.829 m) and weight is 192 lb 3.2 oz (87.2 kg). His temperature is 97.1 F (36.2 C) (abnormal). His blood pressure is 136/92 (abnormal) and his pulse is 72. His respiration is 20 and oxygen saturation is 98%. .  General: Alert and oriented, in no acute distress. HEENT: Head is normocephalic.  Stable mucosal tag at tip of tongue, not a clinical concern. Dried cerumen in left ear Weaver. Neck: skin has healed well in the radiation fields. No palpable masses in the cervical, supraclavicular, or peri-auricular regions.    Skin: Skin in treatment fields shows satisfactory healing.     Lab Findings: Lab Results  Component Value Date   WBC 6.6 03/25/2017   HGB 14.8 03/25/2017   HCT 45.2 03/25/2017   MCV 90.2 03/25/2017   PLT 270 03/25/2017    Lab Results  Component Value Date   TSH 1.091 03/25/2017    Radiographic Findings: No new imaging  No results found.  Impression/Plan:    1) Head and Neck Cancer Status: No evidence of diease.    2) Follow-up in with Timothy Weaver as  scheduled.  I will arrange f/u in our survivorship clinic in March 2022.  He is enthusiastic to meet Timothy Weaver in this clinic. I will see him PRN.  3) We again discussed measures to reduce the risk of infection during the COVID-19 pandemic.  I gave him advice on how to schedule the vaccine, which I highly recommend.  On date of service, in total, I spent 25 minutes on this encounter. _____________________________________   Eppie Gibson, MD

## 2019-07-13 ENCOUNTER — Telehealth: Payer: Self-pay | Admitting: Radiation Oncology

## 2019-07-13 NOTE — Telephone Encounter (Signed)
Scheduled appt per 6/8 sch message - mailed reminder letter with appt date and time.

## 2019-07-16 DIAGNOSIS — D225 Melanocytic nevi of trunk: Secondary | ICD-10-CM | POA: Diagnosis not present

## 2019-07-16 DIAGNOSIS — Z85828 Personal history of other malignant neoplasm of skin: Secondary | ICD-10-CM | POA: Diagnosis not present

## 2019-07-16 DIAGNOSIS — L821 Other seborrheic keratosis: Secondary | ICD-10-CM | POA: Diagnosis not present

## 2019-07-16 DIAGNOSIS — L570 Actinic keratosis: Secondary | ICD-10-CM | POA: Diagnosis not present

## 2019-07-16 DIAGNOSIS — L814 Other melanin hyperpigmentation: Secondary | ICD-10-CM | POA: Diagnosis not present

## 2019-08-02 DIAGNOSIS — H401131 Primary open-angle glaucoma, bilateral, mild stage: Secondary | ICD-10-CM | POA: Diagnosis not present

## 2019-08-09 ENCOUNTER — Other Ambulatory Visit: Payer: Self-pay

## 2019-08-09 ENCOUNTER — Emergency Department (HOSPITAL_COMMUNITY)
Admission: EM | Admit: 2019-08-09 | Discharge: 2019-08-09 | Disposition: A | Discharge status: Home / Self Care | Payer: Medicare Other | Attending: Emergency Medicine | Admitting: Emergency Medicine

## 2019-08-09 ENCOUNTER — Emergency Department (HOSPITAL_COMMUNITY): Payer: Medicare Other

## 2019-08-09 ENCOUNTER — Encounter (HOSPITAL_COMMUNITY): Payer: Self-pay

## 2019-08-09 DIAGNOSIS — Z85858 Personal history of malignant neoplasm of other endocrine glands: Secondary | ICD-10-CM | POA: Diagnosis not present

## 2019-08-09 DIAGNOSIS — Z85828 Personal history of other malignant neoplasm of skin: Secondary | ICD-10-CM | POA: Insufficient documentation

## 2019-08-09 DIAGNOSIS — R42 Dizziness and giddiness: Secondary | ICD-10-CM | POA: Diagnosis not present

## 2019-08-09 DIAGNOSIS — Z79899 Other long term (current) drug therapy: Secondary | ICD-10-CM | POA: Diagnosis not present

## 2019-08-09 DIAGNOSIS — H0589 Other disorders of orbit: Secondary | ICD-10-CM | POA: Diagnosis not present

## 2019-08-09 DIAGNOSIS — I10 Essential (primary) hypertension: Secondary | ICD-10-CM | POA: Diagnosis not present

## 2019-08-09 DIAGNOSIS — H539 Unspecified visual disturbance: Secondary | ICD-10-CM | POA: Insufficient documentation

## 2019-08-09 DIAGNOSIS — I6782 Cerebral ischemia: Secondary | ICD-10-CM | POA: Diagnosis not present

## 2019-08-09 DIAGNOSIS — J3489 Other specified disorders of nose and nasal sinuses: Secondary | ICD-10-CM | POA: Diagnosis not present

## 2019-08-09 DIAGNOSIS — G319 Degenerative disease of nervous system, unspecified: Secondary | ICD-10-CM | POA: Diagnosis not present

## 2019-08-09 DIAGNOSIS — Z7982 Long term (current) use of aspirin: Secondary | ICD-10-CM | POA: Insufficient documentation

## 2019-08-09 LAB — PROTIME-INR
INR: 1 (ref 0.8–1.2)
Prothrombin Time: 13 seconds (ref 11.4–15.2)

## 2019-08-09 LAB — CBC
HCT: 47 % (ref 39.0–52.0)
Hemoglobin: 15.5 g/dL (ref 13.0–17.0)
MCH: 30.2 pg (ref 26.0–34.0)
MCHC: 33 g/dL (ref 30.0–36.0)
MCV: 91.6 fL (ref 80.0–100.0)
Platelets: 224 10*3/uL (ref 150–400)
RBC: 5.13 MIL/uL (ref 4.22–5.81)
RDW: 12.6 % (ref 11.5–15.5)
WBC: 11 10*3/uL — ABNORMAL HIGH (ref 4.0–10.5)
nRBC: 0 % (ref 0.0–0.2)

## 2019-08-09 LAB — COMPREHENSIVE METABOLIC PANEL
ALT: 19 U/L (ref 0–44)
AST: 22 U/L (ref 15–41)
Albumin: 4.9 g/dL (ref 3.5–5.0)
Alkaline Phosphatase: 53 U/L (ref 38–126)
Anion gap: 9 (ref 5–15)
BUN: 16 mg/dL (ref 8–23)
CO2: 26 mmol/L (ref 22–32)
Calcium: 9 mg/dL (ref 8.9–10.3)
Chloride: 103 mmol/L (ref 98–111)
Creatinine, Ser: 0.82 mg/dL (ref 0.61–1.24)
GFR calc Af Amer: >60 mL/min (ref >60)
GFR calc non Af Amer: >60 mL/min (ref >60)
Glucose, Bld: 156 mg/dL — ABNORMAL HIGH (ref 70–99)
Potassium: 3.7 mmol/L (ref 3.5–5.1)
Sodium: 138 mmol/L (ref 135–145)
Total Bilirubin: 1.5 mg/dL — ABNORMAL HIGH (ref 0.3–1.2)
Total Protein: 7.9 g/dL (ref 6.5–8.1)

## 2019-08-09 LAB — DIFFERENTIAL
Abs Immature Granulocytes: 0.03 10*3/uL (ref 0.00–0.07)
Basophils Absolute: 0.1 10*3/uL (ref 0.0–0.1)
Basophils Relative: 1 %
Eosinophils Absolute: 0.1 10*3/uL (ref 0.0–0.5)
Eosinophils Relative: 1 %
Immature Granulocytes: 0 %
Lymphocytes Relative: 9 %
Lymphs Abs: 1 10*3/uL (ref 0.7–4.0)
Monocytes Absolute: 0.4 10*3/uL (ref 0.1–1.0)
Monocytes Relative: 4 %
Neutro Abs: 9.4 10*3/uL — ABNORMAL HIGH (ref 1.7–7.7)
Neutrophils Relative %: 85 %

## 2019-08-09 LAB — CBG MONITORING, ED: Glucose-Capillary: 162 mg/dL — ABNORMAL HIGH (ref 70–99)

## 2019-08-09 LAB — I-STAT BETA HCG BLOOD, ED (MC, WL, AP ONLY): I-stat hCG, quantitative: <5 m[IU]/mL (ref <5)

## 2019-08-09 LAB — APTT: aPTT: 30 seconds (ref 24–36)

## 2019-08-09 MED ORDER — AMLODIPINE BESYLATE 5 MG PO TABS
2.5000 mg | ORAL_TABLET | Freq: Once | ORAL | Status: AC
  Administered 2019-08-09: 2.5 mg via ORAL
  Filled 2019-08-09: qty 1

## 2019-08-09 MED ORDER — AMLODIPINE BESYLATE 2.5 MG PO TABS
2.5000 mg | ORAL_TABLET | Freq: Every day | ORAL | 1 refills | Status: DC
Start: 2019-08-09 — End: 2019-11-09

## 2019-08-09 MED ORDER — SODIUM CHLORIDE 0.9% FLUSH
3.0000 mL | Freq: Once | INTRAVENOUS | Status: AC
  Administered 2019-08-09: 3 mL via INTRAVENOUS

## 2019-08-09 MED ORDER — MECLIZINE HCL 25 MG PO TABS
25.0000 mg | ORAL_TABLET | Freq: Three times a day (TID) | ORAL | 0 refills | Status: DC | PRN
Start: 2019-08-09 — End: 2020-10-12

## 2019-08-09 MED ORDER — ASPIRIN EC 81 MG PO TBEC
81.0000 mg | DELAYED_RELEASE_TABLET | Freq: Every day | ORAL | 1 refills | Status: DC
Start: 2019-08-09 — End: 2022-12-16

## 2019-08-09 MED ORDER — ASPIRIN EC 81 MG PO TBEC
81.0000 mg | DELAYED_RELEASE_TABLET | Freq: Once | ORAL | Status: AC
  Administered 2019-08-09: 81 mg via ORAL
  Filled 2019-08-09: qty 1

## 2019-08-09 NOTE — Discharge Instructions (Signed)
1.  Start a daily 81 mg enteric-coated aspirin.  Take amlodipine 2.5 mg daily as prescribed.  Keep a journal and check your blood pressure twice a day to record your responses to this medication.  If your blood pressure is getting lower than 120/70 and you feel lightheaded, do not take the medication. 2.  Your symptoms appear to be due to peripheral vertigo.  This is a condition where the inner ear creates a motion sensation similar to being "seasick".  It is common to get very nauseated when this occurs.  It is also common to perceive that the symptoms are worsened as you turn your head side to side.  Antivert (meclizine) is a medication to help with the symptoms.  You may take this medication as prescribed to treat the symptoms.  It does not cure the condition.  You will need to see your doctor for further evaluation.  Return to the emergency department immediately if you develop problems with your vision such as double vision or loss of vision, loss of use or sensation in extremity, headache or other concerning symptoms. 3.  Your MRI shows a small lesion in the eye muscle that can be further evaluated by your ophthalmologist.  This does not appear to have any association with your symptoms today.  Your MRI shows small areas of old vascular disease.  There is no appearance of a new or recent stroke.  As we age, often there are small areas of small strokes and vascular disease that have occurred without any associated symptoms.  These are present on your MRI.  Start a daily aspirin and follow-up with your doctor for further management of your blood pressure and risk reduction for any future stroke.

## 2019-08-09 NOTE — ED Provider Notes (Signed)
[facility] DEPT Provider Note   CSN: [id number] Arrival date & time: 08/09/19  1507     History No chief complaint on file.   Timothy Weaver is a 79 y.o. male.  HPI Patient reports that he got up this morning and felt well.  At 1130 he sat down to have oatmeal for breakfast.  At that time he noted that he felt some unsteadiness when he turned his head to the side and like it took his vision a few seconds to "catch up" as he turned his head.  He denies he had any loss of vision.  He reports it made him feel unsteady and he needs to be extra careful when he was walking.  Patient did go on to go to the store with his daughter to do some shopping.  He reports while he was there he did not feel well and needed to rest.  He continued to have the perception of feeling some dizziness and unsteadiness particularly exacerbated position of the head.  He became very nauseated as he rode home.  Once home, he did vomit one large amount.  He did then start to feel better.  He reports at no time did he have any focal weakness numbness or dysfunction of the extremities.  He also did not perceive any loss of any visual fields.  Patient does not take any antihypertensive medications at baseline.  He notes baseline blood pressures typically are 130s to 140s over 70s to 80s.  He checked his blood pressure and it was 150s over 80s.  Over the ensuing hour or so, blood pressures continued to escalate until reaching 180s/ 100.  He reports that he had recent diagnosis of early glaucoma with intraocular pressures of 31.  He reports he is due to start some eyedrops but has not started them yet.  Denies he is having any headache.  No eye pain.    Past Medical History:  Diagnosis Date  . Arthritis   . Facial abscess 01/08/2017  . History of radiation therapy 04/10/17- 05/21/17   Left Parotid treated to 60 Gy with 30 fractions.   . Shingles 2004  . Skin cancer 2019   removal, right superior  knee.   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 Cancer of skin of face 03/19/2017    Past Surgical History:  Procedure Laterality Date  . auriculectomy Left 02/25/2017   Dr. [name]- [facility].   . hearing loss    . [facility], left lower leg.   Marland Kitchen MOUTH SURGERY    . NECK DISSECTION Left 02/25/2017   Dr. [name], [facility].   Marland Kitchen PAROTIDECTOMY Left 02/25/2017   Dr. [name] [facility]  . TONSILLECTOMY    . WISDOM TOOTH EXTRACTION         Family History  Problem Relation Age of Onset  . Heart failure Father     Social History   Tobacco Use  . Smoking status: Never Smoker  . Smokeless tobacco: Never Used  Vaping Use  . Vaping Use: Never used  Substance Use Topics  . Alcohol use: Yes    Comment: occasional glass of wine at dinner  . Drug use: No    Home Medications Prior to Admission medications   Medication Sig Start Date End Date Taking? Authorizing Provider  aspirin 325 MG tablet Take  650 mg by mouth daily as needed for mild pain.   Yes [provider]  cholecalciferol (VITAMIN D3) 25 MCG (1000 UNIT) tablet Take 1,000 Units by mouth daily.   Yes [provider]  Cod Liver Oil 1000 MG CAPS Take 1,000 mg by mouth daily.    Yes [provider]  L-Lysine 500 MG TABS Take 500 tablets by mouth.    Yes [provider]  Multiple Vitamins-Minerals (MULTIVITAMIN WITH MINERALS) tablet Take 1 tablet by mouth daily.    Yes [provider]  vitamin C (ASCORBIC ACID) 500 MG tablet Take 500 mg by mouth daily.    Yes [provider]  vitamin E 200 UNIT capsule Take 200 Units by mouth daily.    Yes [provider]  amLODipine (NORVASC) 2.5 MG tablet Take 1 tablet (2.5 mg total) by mouth daily. 08/09/19   Charlesetta Shanks, MD  aspirin EC 81 MG tablet Take 1 tablet (81 mg total) by mouth  daily. Swallow whole. 08/09/19   Charlesetta Shanks, MD  meclizine (ANTIVERT) 25 MG tablet Take 1 tablet (25 mg total) by mouth 3 (three) times daily as needed for dizziness. 08/09/19   Charlesetta Shanks, MD    Allergies    Other  Review of Systems   Review of Systems 10 systems reviewed and negative except as per HPI Physical Exam Updated Vital Signs BP 140/72 (BP Location: Right Arm)   Pulse 62   Temp 98.4 F (36.9 C) (Oral)   Resp 18   Ht 6' (1.829 m)   Wt 85.3 kg   SpO2 100%   BMI 25.50 kg/m   Physical Exam Constitutional:      Comments: Alert and nontoxic.  Clinically well in appearance.  HENT:     Head:     Comments: Patient has surgical changes of the left jaw and parotid from previous resection and radiation.  He is are all well-healed without any associated edema or appearance of cellulitis.  Overall patient has symmetric appearance of face albeit with some surgical changes.    Ears:     Comments: Left ear canal occluded by cerumen.  Right TM normal.    Mouth/Throat:     Mouth: Mucous membranes are moist.     Pharynx: Oropharynx is clear.  Eyes:     Extraocular Movements: Extraocular movements intact.     Conjunctiva/sclera: Conjunctivae normal.     Pupils: Pupils are equal, round, and reactive to light.  Cardiovascular:     Rate and Rhythm: Normal rate and regular rhythm.     Pulses: Normal pulses.     Heart sounds: Normal heart sounds.  Pulmonary:     Effort: Pulmonary effort is normal.     Breath sounds: Normal breath sounds.  Abdominal:     General: There is no distension.     Palpations: Abdomen is soft.     Tenderness: There is no abdominal tenderness. There is no guarding.  Musculoskeletal:        General: No swelling or tenderness. Normal range of motion.     Cervical back: Neck supple.     Right lower leg: No edema.     Left lower leg: No edema.  Skin:    General: Skin is warm and dry.  Neurological:     General: No focal deficit present.     Mental  Status: He is oriented to person, place, and time.     Cranial Nerves: No cranial nerve deficit.  Sensory: No sensory deficit.     Motor: No weakness.     Coordination: Coordination normal.     Comments: Finger-nose exam normal bilaterally.  Cognitive function and speech normal.  Patient does have subtle lateral nystagmus to the left.  Extraocular motions intact.  Pupils are symmetric and responsive with normal consensual response.  Normal heel shin exam.  Motor strength 5\5 upper and lower extremities.  Psychiatric:        Mood and Affect: Mood normal.     ED Results / Procedures / Treatments   Labs (all labs ordered are listed, but only abnormal results are displayed) Labs Reviewed  CBC - Abnormal; Notable for the following components:      Result Value   WBC 11.0 (*)    All other components within normal limits  DIFFERENTIAL - Abnormal; Notable for the following components:   Neutro Abs 9.4 (*)    All other components within normal limits  COMPREHENSIVE METABOLIC PANEL - Abnormal; Notable for the following components:   Glucose, Bld 156 (*)    Total Bilirubin 1.5 (*)    All other components within normal limits  CBG MONITORING, ED - Abnormal; Notable for the following components:   Glucose-Capillary 162 (*)    All other components within normal limits  PROTIME-INR  APTT  I-STAT CHEM 8, ED  I-STAT BETA HCG BLOOD, ED ([facility], [facility], [facility] ONLY)    EKG None Muse will not allow read. Sinus rhythm 64 PR 228 QTC 411 No ischemic changes.  Slightly long PR otherwise normal.   Radiology MR Brain Wo Contrast (neuro protocol)  Result Date: 08/09/2019 CLINICAL DATA:  Ataxia, stroke suspected. Additional history provided: Loss of balance, difficulty focusing. EXAM: MRI HEAD WITHOUT CONTRAST TECHNIQUE: Multiplanar, multiecho pulse sequences of the brain and surrounding structures were obtained without intravenous contrast. COMPARISON:  No pertinent prior studies available for comparison.  FINDINGS: Brain: There is mild generalized parenchymal atrophy. Mild scattered T2/FLAIR hyperintensity within the cerebral white matter is nonspecific, but consistent with chronic small vessel ischemic disease. Small chronic lacunar infarct within the right corona radiata/lentiform nucleus. Small chronic infarcts within the inferior right cerebellum. There is no acute infarct. No evidence of intracranial mass. No chronic intracranial blood products. No extra-axial fluid collection. No midline shift. Vascular: Expected proximal arterial flow voids. Skull and upper cervical spine: No focal marrow lesion. Incompletely assessed cervical spondylosis. This includes advanced C3-C4 disc space narrowing with degenerative endplate sclerosis. Sinuses/Orbits: There is an indeterminate 6 mm T2 intermediate round lesion within the inferolateral right orbit interposed between the inferior and lateral rectus muscles (series 8, image 8) (series 15, image 27). Mild ethmoid and right maxillary sinus mucosal thickening. No significant mastoid effusion. IMPRESSION: No evidence of acute abnormality within the intracranial compartment. Specifically, there is no evidence of acute or recent subacute infarction. Indeterminate 6 mm round lesion within the inferolateral right orbit, interposed between the inferior and lateral rectus muscles. Nonemergent contrast-enhanced MRI of the orbits is recommended for further characterization. Small chronic infarcts within the right corona radiata/basal ganglia and inferior right cerebellum. Background mild chronic small vessel ischemic disease within the cerebral white matter. Mild ethmoid sinus mucosal thickening. Electronically Signed   By: Kellie Simmering DO   On: 08/09/2019 18:55    Procedures Procedures (including critical care time)  Medications Ordered in ED Medications  sodium chloride flush (NS) 0.9 % injection 3 mL (3 mLs Intravenous Given 08/09/19 1548)  aspirin EC tablet 81 mg (81 mg  Oral Given 08/09/19 2033)  amLODipine (NORVASC) tablet 2.5 mg (2.5 mg Oral Given 08/09/19 2033)    ED Course  I have reviewed the triage vital signs and the nursing notes.  Pertinent labs & imaging results that were available during my care of the patient were reviewed by me and considered in my medical decision making (see chart for details).    MDM Rules/Calculators/A&P                          Patient presents with episode of dizziness with perception of imbalance.  Symptom was exacerbated by turning the head to the side and associated nausea and one episode of vomiting.  On examination patient did have subtle left nystagmus, otherwise all neurologic examination normal with normal cerebellar function.  Patient did not have headache or persistent symptoms.  MRI does not show any acute or subacute CVA.  At this time consistent with peripheral vertigo.  Patient did have blood pressure elevation that occurred right after this event.  He documented his pressure elevating from 150s up to 180s.  This has spontaneously corrected down to 140s over 80s.  Patient describes fairly consistent diastolic blood pressures in the 02I and systolic pressures in the 140s, with occasional elevations.  Will start a low-dose Norvasc 2.5 mg with consistent measurements at home for response to treatment.  Patient may have been experiencing hypertensive symptoms with elevated blood pressure at the time of the incident.  Detailed instructions given for initiating Norvasc and discontinuing if blood pressure is low or symptomatic with blood pressures less than 120/70.  Will also initiate a daily aspirin for stroke prophylaxis with MRI showing old or small vessel ischemic disease.  Follow-up plan and return precautions reviewed. Final Clinical Impression(s) / ED Diagnoses Final diagnoses:  Vertigo  Essential hypertension    Rx / DC Orders ED Discharge Orders         Ordered    amLODipine (NORVASC) 2.5 MG tablet  Daily      Discontinue  Reprint     08/09/19 2023    aspirin EC 81 MG tablet  Daily     Discontinue  Reprint     08/09/19 2023    meclizine (ANTIVERT) 25 MG tablet  3 times daily PRN     Discontinue  Reprint     08/09/19 2023           Charlesetta Shanks, MD 08/09/19 2049

## 2019-08-09 NOTE — ED Triage Notes (Signed)
Patient reports that he began having loss of balance and unable to focus around 1130 today. Patient states these symptoms stopped around 1215 today.  Patient reports that he had N/V prior to the above incident. Patient states he took Aspirin x 2 today also.

## 2019-08-30 DIAGNOSIS — H401131 Primary open-angle glaucoma, bilateral, mild stage: Secondary | ICD-10-CM | POA: Diagnosis not present

## 2019-09-24 ENCOUNTER — Other Ambulatory Visit: Payer: Self-pay | Admitting: Thoracic Surgery (Cardiothoracic Vascular Surgery)

## 2019-09-24 DIAGNOSIS — I712 Thoracic aortic aneurysm, without rupture, unspecified: Secondary | ICD-10-CM

## 2019-11-02 ENCOUNTER — Ambulatory Visit
Admission: RE | Admit: 2019-11-02 | Discharge: 2019-11-02 | Disposition: A | Discharge status: Home / Self Care | Payer: Medicare Other | Source: Ambulatory Visit | Attending: Thoracic Surgery (Cardiothoracic Vascular Surgery) | Admitting: Thoracic Surgery (Cardiothoracic Vascular Surgery)

## 2019-11-02 DIAGNOSIS — I712 Thoracic aortic aneurysm, without rupture, unspecified: Secondary | ICD-10-CM

## 2019-11-02 MED ORDER — IOPAMIDOL (ISOVUE-370) INJECTION 76%
75.0000 mL | Freq: Once | INTRAVENOUS | Status: AC | PRN
  Administered 2019-11-02: 75 mL via INTRAVENOUS

## 2019-11-09 ENCOUNTER — Ambulatory Visit: Payer: Medicare Other | Admitting: Thoracic Surgery (Cardiothoracic Vascular Surgery)

## 2019-11-09 ENCOUNTER — Other Ambulatory Visit: Payer: Self-pay

## 2019-11-09 ENCOUNTER — Encounter: Payer: Self-pay | Admitting: Thoracic Surgery (Cardiothoracic Vascular Surgery)

## 2019-11-09 VITALS — BP 167/106 | HR 76 | Resp 18 | Ht 72.0 in | Wt 193.0 lb

## 2019-11-09 DIAGNOSIS — I712 Thoracic aortic aneurysm, without rupture, unspecified: Secondary | ICD-10-CM

## 2019-11-09 MED ORDER — METOPROLOL SUCCINATE ER 25 MG PO TB24: 25.0000 mg | ORAL_TABLET | Freq: Every day | ORAL | 6 refills | Status: DC

## 2019-11-09 NOTE — Progress Notes (Signed)
Timothy Weaver [address]       [place] [address]             [phone]     HPI: Mr. Timothy Weaver returns for follow-up of an ascending aneurysm.  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  He was found to have an ascending aneurysm on the CT of his chest in February 2019.  That was part of the work-up for his parotid gland cancer.  Follow-up scan in July reported the aneurysm was 4.3 cm.  I have been following him since then.  Last saw him in September 2020.  The aneurysm was 4.1 cm at that time.  In the interim since his last visit he had a severe back in July.  He had some nausea and vomiting and dizziness.  He was seen in the emergency room and is of blood pressure was elevated.  He was diagnosed with vertigo although he thinks he may have had food poisoning.  He was started on amlodipine 2.5 mg daily.  He did not start taking it right away.  He has been monitoring his blood pressure at home.  He says it is generally above 726 systolic and around 90 diastolic.  It has been as high as 180 but usually runs around 140-150. Current Outpatient Medications  Medication Sig Dispense Refill  . amLODipine (NORVASC) 2.5 MG tablet Take 1 tablet (2.5 mg total) by mouth daily. 30 tablet 1  . aspirin EC 81 MG tablet Take 1 tablet (81 mg total) by mouth daily. Swallow whole. 30 tablet 1  . cholecalciferol (VITAMIN D3) 25 MCG (1000 UNIT) tablet Take 1,000 Units by mouth daily.    Marland Kitchen Cod Liver Oil 1000 MG CAPS Take 1,000 mg by mouth daily.     Marland Kitchen L-Lysine 500 MG TABS Take 500 tablets by mouth.     . Multiple Vitamins-Minerals (MULTIVITAMIN WITH MINERALS) tablet Take 1 tablet by mouth daily.     . vitamin C (ASCORBIC ACID) 500 MG tablet Take 500 mg by mouth daily.     . vitamin E 200 UNIT capsule Take 200 Units by mouth daily.     . meclizine (ANTIVERT) 25 MG tablet Take 1  tablet (25 mg total) by mouth 3 (three) times daily as needed for dizziness. (Patient not taking: Reported on 11/09/2019) 30 tablet 0  . metoprolol succinate (TOPROL XL) 25 MG 24 hr tablet Take 1 tablet (25 mg total) by mouth daily. 30 tablet 6   No current facility-administered medications for this visit.    Physical Exam BP (!) 167/106   Pulse 76   Resp 18   Ht 6' (1.829 m)   Wt 193 lb (87.5 kg)   SpO2 93% Comment: RA with mask  BMI 26.57 kg/m  79 year old man in no acute distress Alert and oriented x3 with no focal deficits Lungs clear No carotid bruits Cardiac regular rate and rhythm, no murmur No peripheral edema  Diagnostic Tests: CT ANGIOGRAPHY CHEST WITH CONTRAST  TECHNIQUE: Multidetector CT imaging of the chest was performed using the standard protocol during bolus administration of intravenous contrast. Multiplanar CT image reconstructions and MIPs were obtained to evaluate the vascular anatomy.  CONTRAST:  31mL ISOVUE-370 IOPAMIDOL (ISOVUE-370) INJECTION 76%  COMPARISON:  10/29/2018  FINDINGS: Cardiovascular: Heart size normal. No pericardial effusion. Fair contrast opacification of pulmonary arterial tree; the exam was  not optimized for detection of pulmonary emboli. Good contrast opacification of the thoracic aorta. No dissection or stenosis.  Aortic Root:  --Valve: 3.1 cm  --Sinuses: 3.8 cm  --Sinotubular Junction: 3.2 cm  Limitations by motion: Moderate  Thoracic Aorta:  --Ascending Aorta: 4.1 cm (stable)  --Aortic Arch: 3.3 cm  --Descending Aorta: 3.3  Minimal atheromatous plaque in the descending thoracic segment. Bovine variant brachiocephalic arterial origin anatomy without proximal stenosis. Visualized proximal abdominal aorta unremarkable. Mild narrowing of the proximal celiac axis at the level of the knee median arcuate ligament of the diaphragm is incidentally noted.  Mediastinum/Nodes: No mass or  adenopathy.  Lungs/Pleura: No pneumothorax.  No effusion.  Lungs clear.  Upper Abdomen: No acute findings.  Musculoskeletal: Anterior vertebral endplate spurring at multiple levels in the mid and lower thoracic spine. No fracture or worrisome bone lesion.  Review of the MIP images confirms the above findings.  IMPRESSION: 1. Stable 4.1 cm ascending thoracic aortic aneurysm without complicating features. Recommend annual imaging followup by CTA or MRA. This recommendation follows 2010 ACCF/AHA/AATS/ACR/ASA/SCA/SCAI/SIR/STS/SVM Guidelines for the Diagnosis and Management of Patients with Thoracic Aortic Disease. Circulation. 2010; 121: O841-Y606  Aortic Atherosclerosis (ICD10-I70.0).   Electronically Signed   By: Lucrezia Europe M.D.   On: 11/02/2019 09:49 I personally reviewed the CT images and concur with the findings noted above  Impression: Timothy Weaver is a 79 year old man with a history of squamous cell carcinoma of the parotid gland, hypertension, and ascending aneurysm.  His aneurysm was first noted in 2019.  It was around 4 cm at that time.  It remains stable at 4.1 cm.  He needs continued annual follow-up.  Blood pressure control is the mainstay of treatment.  Hypertension-his blood pressure has been elevated fairly consistently.  He was started on low-dose amlodipine but did not take that for the first few weeks.  He has been on it for the past couple of weeks but his blood pressure still high.  I think Toprol would be a better first-line for him given that he has an aneurysm.  I advised him to stop taking the amlodipine and to start Toprol-XL 25 mg p.o. daily.  Plan: DC amlodipine Toprol-XL 25 mg p.o. daily Monitor blood pressure and notify if systolic above 301 or diastolic above 90 on a consistent basis Return in 1 year with CT angio of chest to follow-up ascending aneurysm.  Melrose Nakayama, MD [facility] [phone]

## 2019-12-01 DIAGNOSIS — C44300 Unspecified malignant neoplasm of skin of unspecified part of face: Secondary | ICD-10-CM | POA: Diagnosis not present

## 2019-12-02 DIAGNOSIS — H0589 Other disorders of orbit: Secondary | ICD-10-CM | POA: Diagnosis not present

## 2019-12-29 DIAGNOSIS — L814 Other melanin hyperpigmentation: Secondary | ICD-10-CM | POA: Diagnosis not present

## 2019-12-29 DIAGNOSIS — C44729 Squamous cell carcinoma of skin of left lower limb, including hip: Secondary | ICD-10-CM | POA: Diagnosis not present

## 2019-12-29 DIAGNOSIS — L821 Other seborrheic keratosis: Secondary | ICD-10-CM | POA: Diagnosis not present

## 2019-12-29 DIAGNOSIS — L570 Actinic keratosis: Secondary | ICD-10-CM | POA: Diagnosis not present

## 2019-12-29 DIAGNOSIS — Z85828 Personal history of other malignant neoplasm of skin: Secondary | ICD-10-CM | POA: Diagnosis not present

## 2020-03-21 DIAGNOSIS — H401131 Primary open-angle glaucoma, bilateral, mild stage: Secondary | ICD-10-CM | POA: Diagnosis not present

## 2020-03-21 DIAGNOSIS — H11153 Pinguecula, bilateral: Secondary | ICD-10-CM | POA: Diagnosis not present

## 2020-04-11 ENCOUNTER — Inpatient Hospital Stay: Payer: Medicare Other | Attending: Medical | Admitting: Medical

## 2020-04-11 ENCOUNTER — Telehealth: Payer: Self-pay | Admitting: Medical

## 2020-04-11 ENCOUNTER — Other Ambulatory Visit: Payer: Self-pay

## 2020-04-11 VITALS — BP 146/91 | HR 72 | Temp 97.7°F | Resp 14 | Ht 72.0 in | Wt 197.3 lb

## 2020-04-11 DIAGNOSIS — C07 Malignant neoplasm of parotid gland: Secondary | ICD-10-CM | POA: Diagnosis not present

## 2020-04-11 DIAGNOSIS — Z79899 Other long term (current) drug therapy: Secondary | ICD-10-CM | POA: Diagnosis not present

## 2020-04-11 DIAGNOSIS — I10 Essential (primary) hypertension: Secondary | ICD-10-CM | POA: Insufficient documentation

## 2020-04-11 NOTE — Telephone Encounter (Signed)
Scheduled appt per 3/8 LOS - gave patient AVS and calender .

## 2020-04-18 NOTE — Progress Notes (Addendum)
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        INTERVAL HISTORY:    BERNHARD KOSKINEN is a 80 y.o. male with a diagnosis of a Clinical: Stage III (cT3, cN0, cM0)  +PNI, negative LVSI, moderately differentiated, negative margins of the left parotid/ left neck / skull base. He presents to clinic today for routine follow up having last been seen on 07/09/2019 by Dr. Eppie Gibson.  He reports that he is doing well with no acute issues of concern.  He is seen by his ear nose and throat doctor 2 times each year and will be seen in April.  He is now to see Dr. Lanell Persons needed.  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  He sees cardiology once each year for his hypertension.  He regularly takes beta-blockers.  His appetite is good.  His weight is up by 4 pounds since his last visit.  He does not see a primary care provider.     REVIEW OF SYSTEMS:   Visual changes:    no   Swelling of the face:    no   Swelling of the mouth:    no   Swelling of the throat:    no   Dental problems:    no   Skin changes at treatment site:  no   Dry mouth:     no   Difficulty talking:    no  Thickened saliva:    no   Increased mucus:    no  Difficulty opening mouth:   no  Fatigue:     no   Pain or difficulty swallowing or chewing: no    Loss of appetite:    no   Numbness of the ear:    no  Weakness raising the arm(s):  no  Lack of lip movement:    no  Facial disfigurement:    no  Numbness of face or neck:   no  Loss of  voice or impaired speech:  no  Hearing difficulty:    yes  Lymphedema:     no  Pain:      no      -Weight: has not lost weight      -Last ENT visit:    October 2021  -Last Rad Onc visit:   07/09/2019   -Last Dentist visit:   Unknown     CURRENT MEDICATIONS:    Current Outpatient Medications on File Prior to Visit  Medication Sig Dispense Refill  . aspirin EC 81 MG tablet Take 1 tablet (81 mg total) by mouth daily. Swallow whole. 30 tablet 1  . cholecalciferol (VITAMIN D3) 25 MCG (1000 UNIT) tablet Take 1,000 Units by mouth daily.    Marland Kitchen Cod Liver Oil 1000 MG CAPS Take 1,000 mg by mouth daily.     Marland Kitchen L-Lysine 500 MG TABS Take 500 tablets by mouth.     Marland Kitchen  meclizine (ANTIVERT) 25 MG tablet Take 1 tablet (25 mg total) by mouth 3 (three) times daily as needed for dizziness. (Patient not taking: Reported on 11/09/2019) 30 tablet 0  . metoprolol succinate (TOPROL XL) 25 MG 24 hr tablet Take 1 tablet (25 mg total) by mouth daily. 30 tablet 6  . Multiple Vitamins-Minerals (MULTIVITAMIN WITH MINERALS) tablet Take 1 tablet by mouth daily.     . vitamin C (ASCORBIC ACID) 500 MG tablet Take 500 mg by mouth daily.     . vitamin E 200 UNIT capsule Take 200 Units by mouth daily.      No current facility-administered medications on file prior to visit.        ALLERGIES:    Allergies  Allergen Reactions  . Other Other (See Comments)    Pollen, Cats : swelling , sneezing        ADDITIONAL REVIEW OF SYSTEMS:    Review of Systems  Constitutional: Negative for chills, diaphoresis, fever, malaise/fatigue and weight loss.  HENT: Positive for hearing loss. Negative for ear discharge, ear pain, sore throat and tinnitus.   Respiratory: Negative for cough, shortness of breath and wheezing.   Cardiovascular: Negative for chest pain, palpitations and orthopnea.  Gastrointestinal: Negative for abdominal pain, constipation, diarrhea, melena, nausea and vomiting.  Genitourinary: Negative for  dysuria, frequency and urgency.  Musculoskeletal: Negative for back pain and neck pain.  Skin: Negative for itching and rash.  Neurological: Negative for dizziness, tingling, sensory change, speech change, weakness and headaches.        PHYSICAL EXAM:    Vitals:   04/11/20 1314  BP: (!) 146/91  Pulse: 72  Resp: 14  Temp: 97.7 F (36.5 C)  SpO2: 98%    Filed Weights   04/11/20 1314  Weight: 197 lb 4.8 oz (89.5 kg)    Weight Date  197  04/11/2020  193  11/09/2019               Pre-treatment (RT consult date):     General: NEFI MUSICH is a 80 y.o. male who appears to be in no acute distress.? Valma Cava is accompanied/unaccompanied today.    HEENT:    Head: atraumatic and normocephalic.? The right ear canal is patent with TM clear without effusion.  The left ear canal is small and blocked with cerumen.  I am unable to appreciate the patient's auditory canal or tympanic membrane.  Pupils: equal and reactive to light.    Conjunctivae: clear without exudate.?    Sclerae: anicteric.    Oral mucosa: pink and moist without lesions.?    Tongue: pink, moist, and midline.     Oropharynx: pink and moist, without lesions.   Lymph: No preauricular, postauricular, cervical, supraclavicular, or infraclavicular lymphadenopathy noted on palpation. ?   Neck: No palpable masses. Skin on neck is slightly thickened on the left neck.    Cardiovascular: Regular rate and rhythm without murmurs, rubs, or gallops.Marland Kitchen   Respiratory: Clear to auscultation bilaterally without wheezes, rales, or rhonchi. Chest expansion symmetric without accessory muscle use; breathing non-labored.?   GI: Abdomen soft and round. Bowel sounds normoactive. No hepatosplenomegaly, masses,  tenderness, rebound, guarding, or distention.   GU: Deferred. ?   Neuro: No focal deficits. Steady gait. ?   Psych: Normal mood and affect for situation.   Extremities: No edema.?   Skin: The patient's  skin is consistent with extensive sun exposure.  He has multiple areas of scarring consistent with removal of  skin cancers.  There is a 1 x 1 mm flat scaling hypopigmented area and the patient's right upper back consistent with what appears to be a skin cancer. (He has an upcoming appointment with dermatology.)    LABORATORY DATA:    The patient's complete chemistry panel returned showing:   Lab Results  Component Value Date   NA 138 08/09/2019   K 3.7 08/09/2019   CL 103 08/09/2019   CO2 26 08/09/2019   GLUCOSE 156 (H) 08/09/2019   BUN 16 08/09/2019   CREATININE 0.82 08/09/2019   CREATININE 0.94 08/15/2017   ALBUMIN 4.9 08/09/2019   ALKPHOS 53 08/09/2019   ALT 19 08/09/2019   AST 22 08/09/2019   BILITOT 1.5 (H) 08/09/2019        The patient's TSH returned at:  Lab Results  Component Value Date   TSH 1.091 03/25/2017        The patient's complete blood count returned showing:      Component Value Date/Time   WBC 11.0 (H) 08/09/2019 1542   RBC 5.13 08/09/2019 1542   HGB 15.5 08/09/2019 1542   HCT 47.0 08/09/2019 1542   PLT 224 08/09/2019 1542   MCV 91.6 08/09/2019 1542   MCH 30.2 08/09/2019 1542   MCHC 33.0 08/09/2019 1542   RDW 12.6 08/09/2019 1542   LYMPHSABS 1.0 08/09/2019 1542   MONOABS 0.4 08/09/2019 1542   EOSABS 0.1 08/09/2019 1542   BASOSABS 0.1 08/09/2019 1542        DIAGNOSTIC IMAGING:    No results found.       ASSESSMENT & PLAN:    LOPAKA KARGE is a 80 y.o. male with a diagnosis of a Clinical: Stage III (cT3, cN0, cM0)  +PNI, negative LVSI, moderately differentiated, negative margins of the left parotid/ left neck / skull base which was originally diagnosed on 03/19/2017. He was treated with radiation from 04/10/2017 - 05/21/2017.  He presents to the survivorship clinic today for routine follow-up having last been seen on 07/09/2019 by Dr. Eppie Gibson.      1. 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 Fotheringham is  clinically without evidence of residual or recurrence cancer on physical exam today.         2. Nutritional status: Mr. Haffey reports that he is currently able to consume adequate nutrition by mouth.  His weight is higher at 197.3 lbs today.      3. At risk for dysphagia: Given Mr. Fedora's treatment for oral and oropharyngeal cancer ( Cancer of parotid gland ), which included radiation therapy, he maybe at risk for chronic dysphagia.  He reports having no difficulty with any foods. He is able to consume soft foods and liquids without difficulty.     4.  At risk for neck lymphedema:  When patients with head & neck cancers are treated with surgery and/or radiation therapy, there is an associated increased risk of neck lymphedema.  Mr. Burright reports that currently he is not experiencing symptoms.     5.  At risk for hypothyroidism: The thyroid gland is often affected after treatment for head & neck cancer.  Mr. Zinn most recent TSH was  Lab Results  Component Value Date   TSH 1.091 03/25/2017   on  03/25/2020.     6. At risk for tooth decay/dental concerns: After treatment with radiation for head & neck cancers, patients often experience dry mouth which increases their risk of dental caries. Mr. Chouinard  was encouraged to see his dentist 3-4 times per year.     7.  Lung cancer screening:  [facility] now offers eligible patients lung cancer screening with a low-dose chest CT to aid in early detection, provide more effective treatment options, and ultimately improve survival benefits for patients diagnosed early.  Below is the selection criteria for screening:    Medicare patients: 55-77 years; privately insured patients 55-80 years.   Active or former smokers who have quit within the last 15 years.   30+ pack-year history of smoking    Exclusion criteria - No signs/symptoms of lung cancer (i.e., no recent history of hemoptysis and no unexplained weight loss >15 pounds in the last 6  months).   Willing and healthy enough to undergo biopsies/surgery if needed.   Mr. Ponder currently does not meet criteria for lung cancer screening.  Therefore, I have not placed a referral for screening today.        8. Tobacco & alcohol use: Mr. Fleck reports that he never smoked.  He continues to drink an occasional glass of red wine.      DISPOSITION:    -See Dr.Sullivan (ENT) in April, 2033  -Return to cancer center to see Dr. Isidore Moos only as needed.    -Return to cancer center to see Survivorship PA in 6 months.       Sandi Mealy, MHS, PA-C Physician Assistant

## 2020-06-07 DIAGNOSIS — C44300 Unspecified malignant neoplasm of skin of unspecified part of face: Secondary | ICD-10-CM | POA: Diagnosis not present

## 2020-06-22 DIAGNOSIS — H401131 Primary open-angle glaucoma, bilateral, mild stage: Secondary | ICD-10-CM | POA: Diagnosis not present

## 2020-06-27 ENCOUNTER — Telehealth: Payer: Self-pay | Admitting: Adult Health

## 2020-06-27 DIAGNOSIS — L570 Actinic keratosis: Secondary | ICD-10-CM | POA: Diagnosis not present

## 2020-06-27 DIAGNOSIS — D1801 Hemangioma of skin and subcutaneous tissue: Secondary | ICD-10-CM | POA: Diagnosis not present

## 2020-06-27 DIAGNOSIS — Z85828 Personal history of other malignant neoplasm of skin: Secondary | ICD-10-CM | POA: Diagnosis not present

## 2020-06-27 DIAGNOSIS — L918 Other hypertrophic disorders of the skin: Secondary | ICD-10-CM | POA: Diagnosis not present

## 2020-06-27 NOTE — Telephone Encounter (Signed)
R/s per 3/8 los, pt aware.

## 2020-09-12 ENCOUNTER — Other Ambulatory Visit: Payer: Self-pay | Admitting: Adult Health

## 2020-09-12 DIAGNOSIS — C44300 Unspecified malignant neoplasm of skin of unspecified part of face: Secondary | ICD-10-CM

## 2020-10-11 ENCOUNTER — Other Ambulatory Visit: Payer: Self-pay | Admitting: *Deleted

## 2020-10-12 ENCOUNTER — Other Ambulatory Visit: Payer: Self-pay

## 2020-10-12 ENCOUNTER — Inpatient Hospital Stay: Payer: Medicare Other | Attending: Adult Health

## 2020-10-12 ENCOUNTER — Other Ambulatory Visit: Payer: Self-pay | Admitting: *Deleted

## 2020-10-12 ENCOUNTER — Other Ambulatory Visit: Payer: Medicare Other

## 2020-10-12 ENCOUNTER — Inpatient Hospital Stay (HOSPITAL_BASED_OUTPATIENT_CLINIC_OR_DEPARTMENT_OTHER): Payer: Medicare Other | Admitting: Adult Health

## 2020-10-12 ENCOUNTER — Encounter: Payer: Self-pay | Admitting: Adult Health

## 2020-10-12 ENCOUNTER — Encounter: Payer: Medicare Other | Admitting: Medical

## 2020-10-12 VITALS — BP 140/95 | HR 69 | Temp 97.9°F | Resp 18 | Ht 72.0 in | Wt 195.6 lb

## 2020-10-12 DIAGNOSIS — Z85828 Personal history of other malignant neoplasm of skin: Secondary | ICD-10-CM | POA: Diagnosis not present

## 2020-10-12 DIAGNOSIS — C44300 Unspecified malignant neoplasm of skin of unspecified part of face: Secondary | ICD-10-CM | POA: Diagnosis not present

## 2020-10-12 DIAGNOSIS — Z923 Personal history of irradiation: Secondary | ICD-10-CM | POA: Insufficient documentation

## 2020-10-12 DIAGNOSIS — H6120 Impacted cerumen, unspecified ear: Secondary | ICD-10-CM | POA: Insufficient documentation

## 2020-10-12 LAB — CBC WITH DIFFERENTIAL (CANCER CENTER ONLY)
Abs Immature Granulocytes: 0.02 10*3/uL (ref 0.00–0.07)
Basophils Absolute: 0.1 10*3/uL (ref 0.0–0.1)
Basophils Relative: 1 %
Eosinophils Absolute: 0.3 10*3/uL (ref 0.0–0.5)
Eosinophils Relative: 4 %
HCT: 44.8 % (ref 39.0–52.0)
Hemoglobin: 15.1 g/dL (ref 13.0–17.0)
Immature Granulocytes: 0 %
Lymphocytes Relative: 28 %
Lymphs Abs: 1.7 10*3/uL (ref 0.7–4.0)
MCH: 29.9 pg (ref 26.0–34.0)
MCHC: 33.7 g/dL (ref 30.0–36.0)
MCV: 88.7 fL (ref 80.0–100.0)
Monocytes Absolute: 0.5 10*3/uL (ref 0.1–1.0)
Monocytes Relative: 8 %
Neutro Abs: 3.5 10*3/uL (ref 1.7–7.7)
Neutrophils Relative %: 59 %
Platelet Count: 229 10*3/uL (ref 150–400)
RBC: 5.05 MIL/uL (ref 4.22–5.81)
RDW: 13.1 % (ref 11.5–15.5)
WBC Count: 6 10*3/uL (ref 4.0–10.5)
nRBC: 0 % (ref 0.0–0.2)

## 2020-10-12 LAB — CMP (CANCER CENTER ONLY)
ALT: 17 U/L (ref 0–44)
AST: 19 U/L (ref 15–41)
Albumin: 4 g/dL (ref 3.5–5.0)
Alkaline Phosphatase: 60 U/L (ref 38–126)
Anion gap: 10 (ref 5–15)
BUN: 14 mg/dL (ref 8–23)
CO2: 23 mmol/L (ref 22–32)
Calcium: 9.3 mg/dL (ref 8.9–10.3)
Chloride: 107 mmol/L (ref 98–111)
Creatinine: 0.92 mg/dL (ref 0.61–1.24)
GFR, Estimated: >60 mL/min (ref >60)
Glucose, Bld: 114 mg/dL — ABNORMAL HIGH (ref 70–99)
Potassium: 4.2 mmol/L (ref 3.5–5.1)
Sodium: 140 mmol/L (ref 135–145)
Total Bilirubin: 1.4 mg/dL — ABNORMAL HIGH (ref 0.3–1.2)
Total Protein: 7.2 g/dL (ref 6.5–8.1)

## 2020-10-12 NOTE — Progress Notes (Signed)
CLINIC:  Survivorship  REASON FOR VISIT:  Routine follow-up for history of head & neck cancer.  BRIEF ONCOLOGIC HISTORY:  Timothy Weaver is a 80 y.o. male with a diagnosis of a Clinical: Stage III (cT3, cN0, cM0)  +PNI, negative LVSI, moderately differentiated, negative margins of the left parotid/ left neck / skull base   INTERVAL HISTORY:  Patient is feeling well, has middle ear pain, cramps occasionally in the muscles around the jaw, he notes when he clenches his jaw he has some slight pain in his lower bilateral posterior mandible  -Pain: left ear in the middle ear -Nutrition/Diet: niormal -Dysphagia?: none, no lymphedema -Dental issues?: using fluoride trays?  -Last TSH:  -Weight: down to 195, his goal weight is below 190  -Last ENT visit:  sees annually    ADDITIONAL REVIEW OF SYSTEMS:  Review of Systems  Constitutional:  Negative for appetite change, chills, fatigue, fever and unexpected weight change.  HENT:   Negative for hearing loss, lump/mass and trouble swallowing.   Eyes:  Negative for eye problems and icterus.  Respiratory:  Negative for chest tightness, cough and shortness of breath.   Cardiovascular:  Negative for chest pain, leg swelling and palpitations.  Gastrointestinal:  Negative for abdominal distention, abdominal pain, constipation, diarrhea, nausea and vomiting.  Endocrine: Negative for hot flashes.  Genitourinary:  Negative for difficulty urinating.   Musculoskeletal:  Negative for arthralgias.  Skin:  Negative for itching and rash.  Neurological:  Negative for dizziness, extremity weakness, headaches and numbness.  Hematological:  Negative for adenopathy. Does not bruise/bleed easily.  Psychiatric/Behavioral:  Negative for depression. The patient is not nervous/anxious.      CURRENT MEDICATIONS:  Current Outpatient Medications on File Prior to Visit  Medication Sig Dispense Refill   aspirin EC 81 MG tablet Take 1 tablet (81 mg total) by  mouth daily. Swallow whole. 30 tablet 1   cholecalciferol (VITAMIN D3) 25 MCG (1000 UNIT) tablet Take 1,000 Units by mouth daily.     Cod Liver Oil 1000 MG CAPS Take 1,000 mg by mouth daily.      L-Lysine 500 MG TABS Take 500 tablets by mouth.      metoprolol succinate (TOPROL XL) 25 MG 24 hr tablet Take 1 tablet (25 mg total) by mouth daily. 30 tablet 6   Multiple Vitamins-Minerals (MULTIVITAMIN WITH MINERALS) tablet Take 1 tablet by mouth daily.      vitamin C (ASCORBIC ACID) 500 MG tablet Take 500 mg by mouth daily.      vitamin E 200 UNIT capsule Take 200 Units by mouth daily.      No current facility-administered medications on file prior to visit.    ALLERGIES:  Allergies  Allergen Reactions   Other Other (See Comments)    Pollen, Cats : swelling , sneezing     PHYSICAL EXAM:  Vitals:   10/12/20 1140  BP: (!) 140/95  Pulse: 69  Resp: 18  Temp: 97.9 F (36.6 C)  SpO2: 96%   Filed Weights   10/12/20 1140  Weight: 195 lb 9.6 oz (88.7 kg)   General: Well-nourished, well-appearing male in no acute distress.  Unaccompanied today.  HEENT: Head is atraumatic and normocephalic.  Pupils equal and reactive to light. Conjunctivae clear without exudate.  Sclerae anicteric. Oral mucosa is pink and moist without lesions.  Tongue pink, moist, and midline. Oropharynx is pink and moist, without lesions. Lymph: No preauricular, postauricular, cervical, supraclavicular, or infraclavicular lymphadenopathy noted on palpation.  Neck: No palpable masses. Skin on neck is intact, no nodules noted.  Cardiovascular: Normal rate and rhythm. Respiratory: Clear to auscultation bilaterally. Chest expansion symmetric without accessory muscle use; breathing non-labored.  GI: Abdomen soft and round. Non-tender, non-distended. Bowel sounds normoactive.  GU: Deferred.   Neuro: No focal deficits. Steady gait.   Psych: Normal mood and affect for situation. Extremities: No edema.  Skin: Warm and dry.     LABORATORY DATA:  Appointment on 10/12/2020  Component Date Value Ref Range Status   Sodium 10/12/2020 140  135 - 145 mmol/L Final   Potassium 10/12/2020 4.2  3.5 - 5.1 mmol/L Final   Chloride 10/12/2020 107  98 - 111 mmol/L Final   CO2 10/12/2020 23  22 - 32 mmol/L Final   Glucose, Bld 10/12/2020 114 (A) 70 - 99 mg/dL Final   Glucose reference range applies only to samples taken after fasting for at least 8 hours.   BUN 10/12/2020 14  8 - 23 mg/dL Final   Creatinine 10/12/2020 0.92  0.61 - 1.24 mg/dL Final   Calcium 10/12/2020 9.3  8.9 - 10.3 mg/dL Final   Total Protein 10/12/2020 7.2  6.5 - 8.1 g/dL Final   Albumin 10/12/2020 4.0  3.5 - 5.0 g/dL Final   AST 10/12/2020 19  15 - 41 U/L Final   ALT 10/12/2020 17  0 - 44 U/L Final   Alkaline Phosphatase 10/12/2020 60  38 - 126 U/L Final   Total Bilirubin 10/12/2020 1.4 (A) 0.3 - 1.2 mg/dL Final   GFR, Estimated 10/12/2020 >60  >60 mL/min Final   Comment: (NOTE) Calculated using the CKD-EPI Creatinine Equation (2021)    Anion gap 10/12/2020 10  5 - 15 Final   Performed at [facility], [place] [address], [place], [place] [address]   WBC Count 10/12/2020 6.0  4.0 - 10.5 K/uL Final   RBC 10/12/2020 5.05  4.22 - 5.81 MIL/uL Final   Hemoglobin 10/12/2020 15.1  13.0 - 17.0 g/dL Final   HCT 10/12/2020 44.8  39.0 - 52.0 % Final   MCV 10/12/2020 88.7  80.0 - 100.0 fL Final   MCH 10/12/2020 29.9  26.0 - 34.0 pg Final   MCHC 10/12/2020 33.7  30.0 - 36.0 g/dL Final   RDW 10/12/2020 13.1  11.5 - 15.5 % Final   Platelet Count 10/12/2020 229  150 - 400 K/uL Final   nRBC 10/12/2020 0.0  0.0 - 0.2 % Final   Neutrophils Relative % 10/12/2020 59  % Final   Neutro Abs 10/12/2020 3.5  1.7 - 7.7 K/uL Final   Lymphocytes Relative 10/12/2020 28  % Final   Lymphs Abs 10/12/2020 1.7  0.7 - 4.0 K/uL Final   Monocytes Relative 10/12/2020 8  % Final   Monocytes Absolute 10/12/2020 0.5  0.1 - 1.0 K/uL Final   Eosinophils Relative  10/12/2020 4  % Final   Eosinophils Absolute 10/12/2020 0.3  0.0 - 0.5 K/uL Final   Basophils Relative 10/12/2020 1  % Final   Basophils Absolute 10/12/2020 0.1  0.0 - 0.1 K/uL Final   Immature Granulocytes 10/12/2020 0  % Final   Abs Immature Granulocytes 10/12/2020 0.02  0.00 - 0.07 K/uL Final   Performed at [facility], [place] [address] [place], [place] [address]     DIAGNOSTIC IMAGING:  None at this visit.    ASSESSMENT & PLAN:  Timothy Weaver is a pleasant 80 y.o. 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  Patient presents to survivorship clinic today for routine follow-up after finishing treatment.   1. Squamous Cell Cancer:  Timothy Weaver is clinically without evidence of disease or recurrence on physical exam today.     2. Nutritional status: Timothy Weaver reports that he is currently able to consume adequate nutrition by mouth.  His weight is stable.    3. Ear Pain: His ear is impacted with wax, I cannot view the TM.  I suggested he get some debrox ear wax removal, work on softening and getting the wax out.  If his ear is still painful after removing the wax, I let him know he could stop by and I would look in his ear.    4. Health maintenance and wellness promotion: Cancer patients who consume a diet rich in fruits and vegetables have better overall health and decreased risk of cancer recurrence. Timothy Weaver was encouraged to consume 5-7 servings of fruits and vegetables per day, as tolerated. Timothy Weaver was also encouraged to engage in moderate to vigorous exercise for 30 minutes per day most days of the week.   5. Support services/counseling: It is not uncommon for this period of the patient's cancer care trajectory to be one of many emotions and stressors.  ent.   Timothy Weaver was encouraged to take advantage of our many other support services programs, support groups, and/or counseling in coping with his new life as a  cancer survivor after completing anti-cancer treatment. The patient was offered support today through active listening and expressive supportive counseling.     Dispo:  -Return in 6 years for f/u.    Total encounter time: 30 minutes  Wilber Bihari, NP 10/14/20 1:09 PM Medical Oncology and Hematology [facility] [place], [place] [address] Tel. [phone]    Fax. [phone]  *Total Encounter Time as defined by the Centers for Medicare and Medicaid Services includes, in addition to the face-to-face time of a patient visit (documented in the note above) non-face-to-face time: obtaining and reviewing outside history, ordering and reviewing medications, tests or procedures, care coordination (communications with other health care professionals or caregivers) and documentation in the medical record.

## 2020-10-16 ENCOUNTER — Other Ambulatory Visit: Payer: Self-pay

## 2020-10-16 ENCOUNTER — Other Ambulatory Visit: Payer: Self-pay | Admitting: Thoracic Surgery (Cardiothoracic Vascular Surgery)

## 2020-10-16 DIAGNOSIS — I712 Thoracic aortic aneurysm, without rupture, unspecified: Secondary | ICD-10-CM

## 2020-10-16 DIAGNOSIS — C44300 Unspecified malignant neoplasm of skin of unspecified part of face: Secondary | ICD-10-CM

## 2020-11-27 ENCOUNTER — Ambulatory Visit
Admission: RE | Admit: 2020-11-27 | Discharge: 2020-11-27 | Disposition: A | Discharge status: Home / Self Care | Payer: Medicare Other | Source: Ambulatory Visit | Attending: Thoracic Surgery (Cardiothoracic Vascular Surgery) | Admitting: Thoracic Surgery (Cardiothoracic Vascular Surgery)

## 2020-11-27 ENCOUNTER — Other Ambulatory Visit: Payer: Self-pay

## 2020-11-27 ENCOUNTER — Ambulatory Visit: Payer: Medicare Other | Admitting: Physician Assistant

## 2020-11-27 VITALS — BP 148/83 | HR 64 | Resp 20 | Ht 72.0 in | Wt 195.0 lb

## 2020-11-27 DIAGNOSIS — I712 Thoracic aortic aneurysm, without rupture, unspecified: Secondary | ICD-10-CM

## 2020-11-27 MED ORDER — IOPAMIDOL (ISOVUE-370) INJECTION 76%
75.0000 mL | Freq: Once | INTRAVENOUS | Status: AC | PRN
  Administered 2020-11-27: 75 mL via INTRAVENOUS

## 2020-11-27 NOTE — Progress Notes (Signed)
[address]       [place] [address]             [phone]     HPI: Timothy Weaver returns for follow-up of an ascending aneurysm.  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  He was found to have an ascending aneurysm on the CT of his chest in February 2019.  That was part of the work-up for his parotid gland cancer.  Follow-up scan in July reported the aneurysm was 4.3 cm.    Dr. Roxan Hockey saw him in September 2020.  The aneurysm was 4.1 cm at that time.  In July 2021,  he had some nausea and vomiting and dizziness.  He was seen in the emergency room and is of blood pressure was elevated.  He was diagnosed with vertigo although he thinks he may have had food poisoning.  He was started on amlodipine 2.5 mg daily.  He did not start taking it right away.  Since that episode he has not had any further episodes of vertigo.  He has been monitoring his blood pressure at home.  I reviewed his blood pressure log and he usually runs anywhere from 253-664 systolic.  He tries to maintain an active lifestyle and has been watching his diet closely.  He does not have a cardiologist or primary care provider at this time.  I did recommend establishing a primary care provider and getting an echocardiogram since he has experienced increased fatigue over the last few years.  This would also be helpful to evaluate his aortic valve.   Current Outpatient Medications on File Prior to Visit  Medication Sig Dispense Refill   aspirin EC 81 MG tablet Take 1 tablet (81 mg total) by mouth daily. Swallow whole. 30 tablet 1   cholecalciferol (VITAMIN D3) 25 MCG (1000 UNIT) tablet Take 1,000 Units by mouth daily.     Cod Liver Oil 1000 MG CAPS Take 1,000 mg by mouth daily.      L-Lysine 500 MG TABS Take 500 tablets by mouth.      Multiple Vitamins-Minerals (MULTIVITAMIN  WITH MINERALS) tablet Take 1 tablet by mouth daily.      vitamin C (ASCORBIC ACID) 500 MG tablet Take 500 mg by mouth daily.      vitamin E 200 UNIT capsule Take 200 Units by mouth daily.      metoprolol succinate (TOPROL XL) 25 MG 24 hr tablet Take 1 tablet (25 mg total) by mouth daily. (Patient not taking: Reported on 11/27/2020) 30 tablet 6   No current facility-administered medications on file prior to visit.      Physical Exam Vitals:   11/27/20 1300  BP: (!) 148/83  Pulse: 64  Resp: 20  SpO57: 57%    80 year old man in no acute distress Alert and oriented x3 with no focal deficits Lungs clear to auscultation bilaterally and in all fields Cardiac regular rate and rhythm, no murmur No abdominal tenderness No peripheral edema  Diagnostic Tests:  CLINICAL DATA:  Evaluate thoracic aortic aneurysm.   EXAM: CT ANGIOGRAPHY CHEST WITH CONTRAST   TECHNIQUE: Multidetector CT imaging of the chest was performed using the standard protocol during bolus administration of intravenous contrast. Multiplanar CT image reconstructions and MIPs were obtained to evaluate the vascular anatomy.   CONTRAST:  57mL ISOVUE-370 IOPAMIDOL (ISOVUE-370) INJECTION 76%   COMPARISON:  11/02/2019   FINDINGS: Cardiovascular: The ascending thoracic aorta measures 4.1 cm, image 81/5. This is stable compared with the previous exam. Heart size is normal. No pericardial effusion.   Mediastinum/Nodes: No enlarged mediastinal, hilar, or axillary lymph nodes. Thyroid gland, trachea, and esophagus demonstrate no significant findings.   Lungs/Pleura: There is no pleural effusion, airspace consolidation, or atelectasis. No pneumothorax. No suspicious pulmonary nodules identified.   Upper Abdomen: No acute findings.   Musculoskeletal: No chest wall abnormality. No acute or significant osseous findings. Degenerative disc disease noted within the thoracic spine.   Review of the MIP images confirms the  above findings.   IMPRESSION: 1. Stable aneurysmal dilatation of the ascending thoracic aorta measuring 4.1 cm. Recommend annual imaging followup by CTA or MRA. This recommendation follows 2010 ACCF/AHA/AATS/ACR/ASA/SCA/SCAI/SIR/STS/SVM Guidelines for the Diagnosis and Management of Patients with Thoracic Aortic Disease. Circulation. 2010; 121: B341-P379. Aortic aneurysm NOS (ICD10-I71.9) 2. No acute cardiopulmonary abnormalities.     Electronically Signed   By: Kerby Moors M.D.   On: 11/27/2020 12:42 CT ANGIOGRAPHY CHEST WITH CONTRAST   TECHNIQUE: Multidetector CT imaging of the chest was performed using the standard protocol during bolus administration of intravenous contrast. Multiplanar CT image reconstructions and MIPs were obtained to evaluate the vascular anatomy.   CONTRAST:  25mL ISOVUE-370 IOPAMIDOL (ISOVUE-370) INJECTION 76%   COMPARISON:  10/29/2018   FINDINGS: Cardiovascular: Heart size normal. No pericardial effusion. Fair contrast opacification of pulmonary arterial tree; the exam was not optimized for detection of pulmonary emboli. Good contrast opacification of the thoracic aorta. No dissection or stenosis.   Aortic Root:   --Valve: 3.1 cm   --Sinuses: 3.8 cm   --Sinotubular Junction: 3.2 cm   Limitations by motion: Moderate   Thoracic Aorta:   --Ascending Aorta: 4.1 cm (stable)   --Aortic Arch: 3.3 cm   --Descending Aorta: 3.3   Minimal atheromatous plaque in the descending thoracic segment. Bovine variant brachiocephalic arterial origin anatomy without proximal stenosis. Visualized proximal abdominal aorta unremarkable. Mild narrowing of the proximal celiac axis at the level of the knee median arcuate ligament of the diaphragm is incidentally noted.   Mediastinum/Nodes: No mass or adenopathy.   Lungs/Pleura: No pneumothorax.  No effusion.  Lungs clear.   Upper Abdomen: No acute findings.   Musculoskeletal: Anterior vertebral  endplate spurring at multiple levels in the mid and lower thoracic spine. No fracture or worrisome bone lesion.   Review of the MIP images confirms the above findings.   IMPRESSION: 1. Stable 4.1 cm ascending thoracic aortic aneurysm without complicating features. Recommend annual imaging followup by CTA or MRA. This recommendation follows 2010 ACCF/AHA/AATS/ACR/ASA/SCA/SCAI/SIR/STS/SVM Guidelines for the Diagnosis and Management of Patients with Thoracic Aortic Disease. Circulation. 2010; 121: K240-X735   Aortic Atherosclerosis (ICD10-I70.0).     Electronically Signed   By: Lucrezia Europe M.D.   On: 11/02/2019 09:49   Impression: Timothy Weaver is a 80 year old man with a history of squamous cell carcinoma of the parotid gland, hypertension, and ascending aneurysm.  He does have some arthritis that bothers him from time to time.  He has not had any significant shortness of breath or chest pain over the last year.  His aneurysm was first noted in 2019.  It was around 4 cm at that time.  It remains stable at 4.1 cm.  He needs continued annual follow-up.  Blood pressure control is the mainstay of treatment.  Hypertension-according to his blood pressure log that he presented to me today his blood pressure  has been stable over the last several months.  At some point his Norvasc was discontinued and he was prescribed metoprolol which she has not started yet.  Plan: Monitor blood pressure and notify if systolic above 505 or diastolic above 90 on a consistent basis Return in 1 year with CT angio of chest to follow-up ascending aneurysm. Establish a primary care provider and get a baseline echocardiogram  Nicholes Rough, PA-C [facility] [phone]

## 2020-12-21 DIAGNOSIS — H401131 Primary open-angle glaucoma, bilateral, mild stage: Secondary | ICD-10-CM | POA: Diagnosis not present

## 2021-01-03 DIAGNOSIS — Z85828 Personal history of other malignant neoplasm of skin: Secondary | ICD-10-CM | POA: Diagnosis not present

## 2021-01-03 DIAGNOSIS — D044 Carcinoma in situ of skin of scalp and neck: Secondary | ICD-10-CM | POA: Diagnosis not present

## 2021-01-03 DIAGNOSIS — L821 Other seborrheic keratosis: Secondary | ICD-10-CM | POA: Diagnosis not present

## 2021-01-03 DIAGNOSIS — D485 Neoplasm of uncertain behavior of skin: Secondary | ICD-10-CM | POA: Diagnosis not present

## 2021-01-03 DIAGNOSIS — L570 Actinic keratosis: Secondary | ICD-10-CM | POA: Diagnosis not present

## 2021-01-03 DIAGNOSIS — D2371 Other benign neoplasm of skin of right lower limb, including hip: Secondary | ICD-10-CM | POA: Diagnosis not present

## 2021-06-07 DIAGNOSIS — H6122 Impacted cerumen, left ear: Secondary | ICD-10-CM | POA: Diagnosis not present

## 2021-06-07 DIAGNOSIS — I712 Thoracic aortic aneurysm, without rupture, unspecified: Secondary | ICD-10-CM | POA: Diagnosis not present

## 2021-06-07 DIAGNOSIS — Z85818 Personal history of malignant neoplasm of other sites of lip, oral cavity, and pharynx: Secondary | ICD-10-CM | POA: Diagnosis not present

## 2021-06-07 DIAGNOSIS — Z0000 Encounter for general adult medical examination without abnormal findings: Secondary | ICD-10-CM | POA: Diagnosis not present

## 2021-06-08 DIAGNOSIS — Z85858 Personal history of malignant neoplasm of other endocrine glands: Secondary | ICD-10-CM | POA: Diagnosis not present

## 2021-06-08 DIAGNOSIS — E7800 Pure hypercholesterolemia, unspecified: Secondary | ICD-10-CM | POA: Diagnosis not present

## 2021-06-08 DIAGNOSIS — I712 Thoracic aortic aneurysm, without rupture, unspecified: Secondary | ICD-10-CM | POA: Diagnosis not present

## 2021-06-08 DIAGNOSIS — Z9889 Other specified postprocedural states: Secondary | ICD-10-CM | POA: Diagnosis not present

## 2021-06-08 DIAGNOSIS — I10 Essential (primary) hypertension: Secondary | ICD-10-CM | POA: Diagnosis not present

## 2021-06-08 DIAGNOSIS — Z0001 Encounter for general adult medical examination with abnormal findings: Secondary | ICD-10-CM | POA: Diagnosis not present

## 2021-06-08 DIAGNOSIS — H6122 Impacted cerumen, left ear: Secondary | ICD-10-CM | POA: Diagnosis not present

## 2021-06-21 DIAGNOSIS — H401131 Primary open-angle glaucoma, bilateral, mild stage: Secondary | ICD-10-CM | POA: Diagnosis not present

## 2021-07-04 DIAGNOSIS — Z85828 Personal history of other malignant neoplasm of skin: Secondary | ICD-10-CM | POA: Diagnosis not present

## 2021-07-04 DIAGNOSIS — L821 Other seborrheic keratosis: Secondary | ICD-10-CM | POA: Diagnosis not present

## 2021-07-04 DIAGNOSIS — D1801 Hemangioma of skin and subcutaneous tissue: Secondary | ICD-10-CM | POA: Diagnosis not present

## 2021-07-04 DIAGNOSIS — L570 Actinic keratosis: Secondary | ICD-10-CM | POA: Diagnosis not present

## 2021-07-07 IMAGING — MR MR HEAD W/O CM
10 series · 48 of 48 positions shown · non-contrast
Comparison: No pertinent prior studies available for comparison.

CLINICAL DATA: Ataxia, stroke suspected. Additional history
provided: Loss of balance, difficulty focusing.

EXAM:
MRI HEAD WITHOUT CONTRAST
TECHNIQUE: Multiplanar, multiecho pulse sequences of the brain and surrounding
structures were obtained without intravenous contrast.

[Series 5: DWI · axial · 3.0mm · 1.36mm/px · z∈[-35,+106]mm · 7 of 96 slices shown (1 of 4)]
[im 1/96]
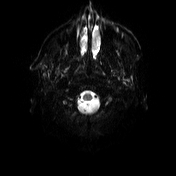
[im 16/96]
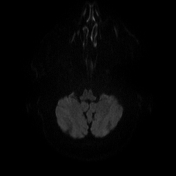
[im 32/96]
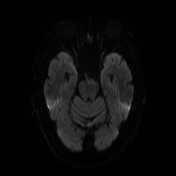
[im 48/96]
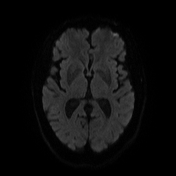
[im 64/96]
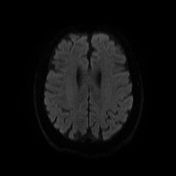
[im 80/96]
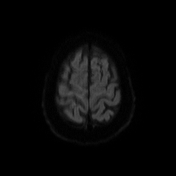
[im 96/96]
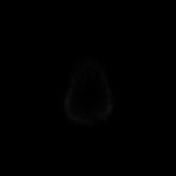

[Series 6: DWI · axial · 3.0mm · 1.36mm/px · z∈[-35,+106]mm · 4 of 48 slices shown (2 of 4)]
[im 1/48]
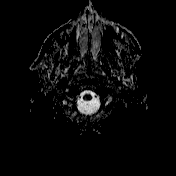
[im 16/48]
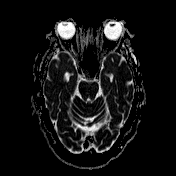
[im 32/48]
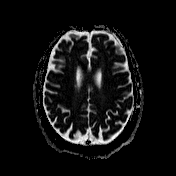
[im 48/48]
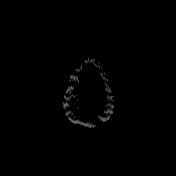

[Series 7: T1 · sagittal · 5.0mm · 0.75mm/px · 2 of 24 slices shown (1 of 2)]
[im 1/24]
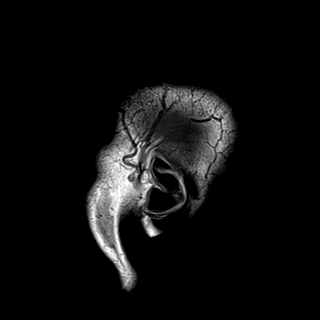
[im 24/24]
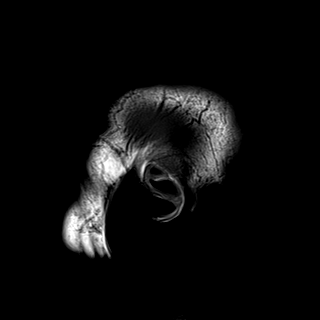

[Series 8: T2 · axial · 5.0mm · 0.62mm/px · z∈[-46,+117]mm · 2 of 26 slices shown (1 of 2)]
[im 1/26]
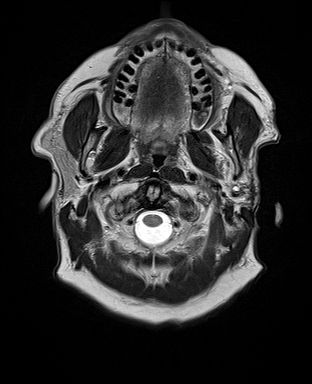
[im 26/26]
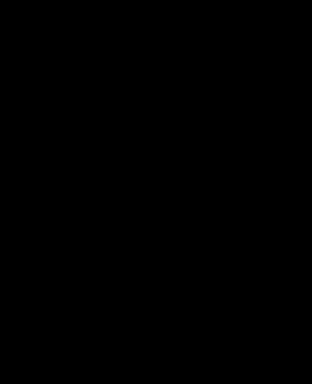

[Series 10: swi_images · axial · 3.0mm · 0.75mm/px · z∈[-71,+142]mm · 6 of 72 slices shown]
[im 1/72]
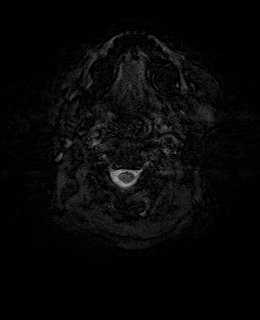
[im 15/72]
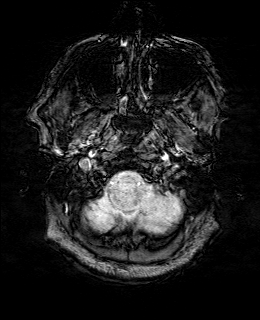
[im 29/72]
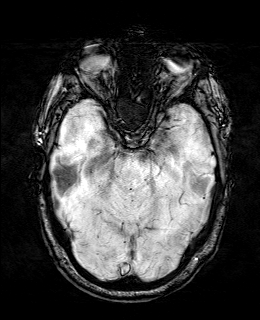
[im 43/72]
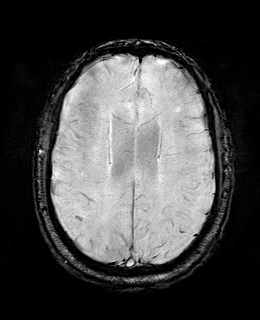
[im 57/72]
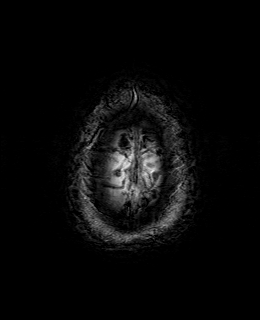
[im 72/72]
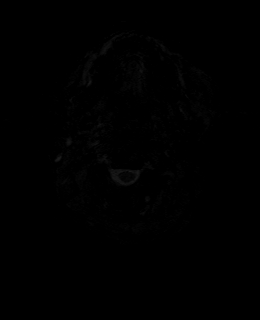

[Series 11: FLAIR · axial · 3.0mm · 0.75mm/px · z∈[-41,+112]mm · 4 of 52 slices shown]
[im 1/52]
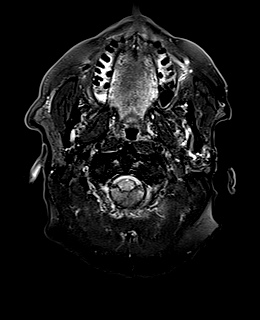
[im 18/52]
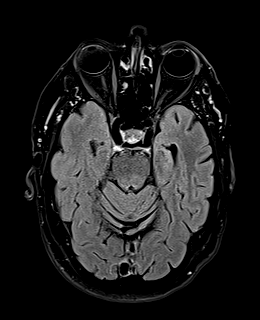
[im 35/52]
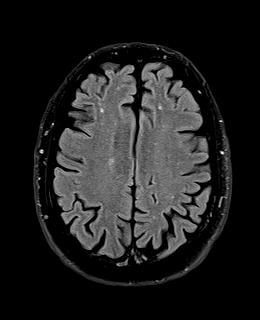
[im 52/52]
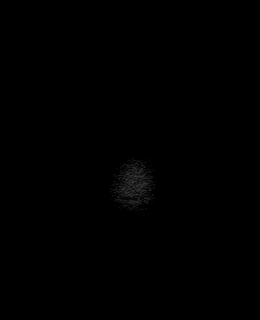

[Series 12: T1 · axial · 1.0mm · 0.94mm/px · z∈[-36,+107]mm · 12 of 144 slices shown (2 of 2)]
[im 1/144]
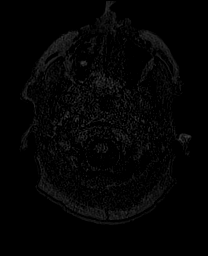
[im 14/144]
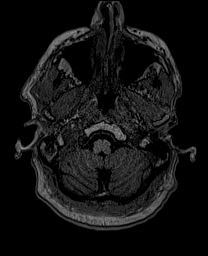
[im 27/144]
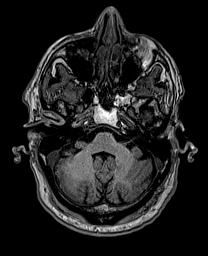
[im 40/144]
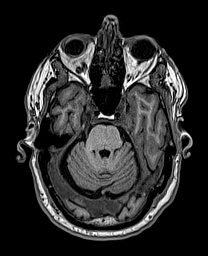
[im 53/144]
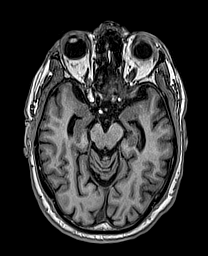
[im 66/144]
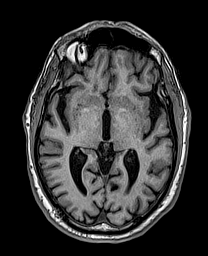
[im 79/144]
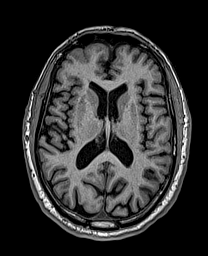
[im 92/144]
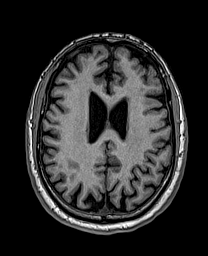
[im 105/144]
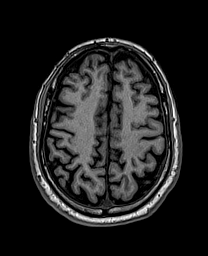
[im 118/144]
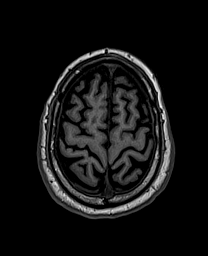
[im 131/144]
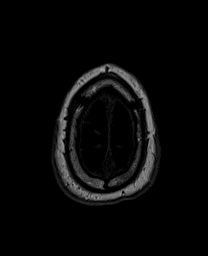
[im 144/144]
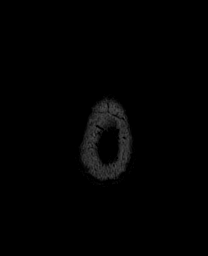

[Series 13: DWI · coronal · 5.0mm · 1.31mm/px · 5 of 64 slices shown (3 of 4)]
[im 1/64]
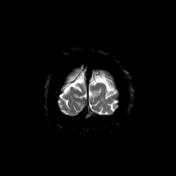
[im 16/64]
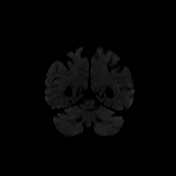
[im 32/64]
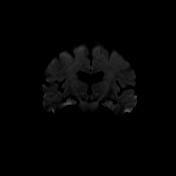
[im 48/64]
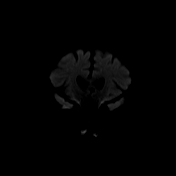
[im 64/64]
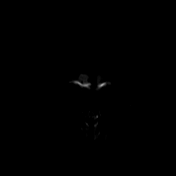

[Series 14: DWI · coronal · 5.0mm · 1.31mm/px · 3 of 32 slices shown (4 of 4)]
[im 1/32]
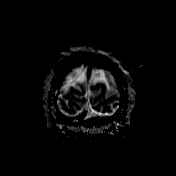
[im 16/32]
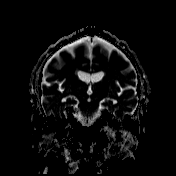
[im 32/32]
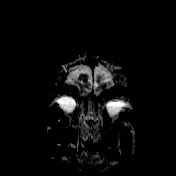

[Series 15: T2 · coronal · 5.0mm · 0.57mm/px · 3 of 32 slices shown (2 of 2)]
[im 1/32]
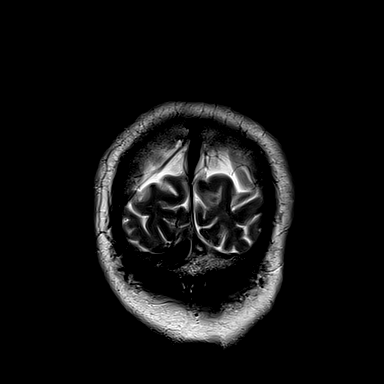
[im 16/32]
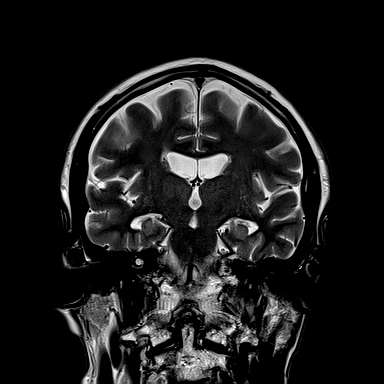
[im 32/32]
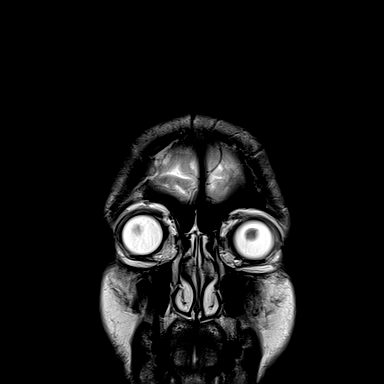

[48 of 48 positions shown; findings below may reference images not displayed]

FINDINGS: Brain:

There is mild generalized parenchymal atrophy.

Mild scattered T2/FLAIR hyperintensity within the cerebral white
matter is nonspecific, but consistent with chronic small vessel
ischemic disease.

Small chronic lacunar infarct within the right corona
radiata/lentiform nucleus. Small chronic infarcts within the
inferior right cerebellum.

There is no acute infarct.

No evidence of intracranial mass.

No chronic intracranial blood products.

No extra-axial fluid collection.

No midline shift.

Vascular: Expected proximal arterial flow voids.

Skull and upper cervical spine: No focal marrow lesion. Incompletely
assessed cervical spondylosis. This includes advanced C3-C4 disc
space narrowing with degenerative endplate sclerosis.

Sinuses/Orbits: There is an indeterminate 6 mm T2 intermediate round
lesion within the inferolateral right orbit interposed between the
inferior and lateral rectus muscles (series 8, image 8) (series 15,
image 27). Mild ethmoid and right maxillary sinus mucosal
thickening. No significant mastoid effusion.
IMPRESSION: No evidence of acute abnormality within the intracranial
compartment. Specifically, there is no evidence of acute or recent
subacute infarction.

Indeterminate 6 mm round lesion within the inferolateral right
orbit, interposed between the inferior and lateral rectus muscles.
Nonemergent contrast-enhanced MRI of the orbits is recommended for
further characterization.

Small chronic infarcts within the right corona radiata/basal ganglia
and inferior right cerebellum. Background mild chronic small vessel
ischemic disease within the cerebral white matter.

Mild ethmoid sinus mucosal thickening.

## 2021-07-11 DIAGNOSIS — C44300 Unspecified malignant neoplasm of skin of unspecified part of face: Secondary | ICD-10-CM | POA: Diagnosis not present

## 2021-07-11 DIAGNOSIS — Z08 Encounter for follow-up examination after completed treatment for malignant neoplasm: Secondary | ICD-10-CM | POA: Diagnosis not present

## 2021-07-11 DIAGNOSIS — Z85828 Personal history of other malignant neoplasm of skin: Secondary | ICD-10-CM | POA: Diagnosis not present

## 2021-07-18 DIAGNOSIS — I712 Thoracic aortic aneurysm, without rupture, unspecified: Secondary | ICD-10-CM | POA: Diagnosis not present

## 2021-09-05 DIAGNOSIS — E785 Hyperlipidemia, unspecified: Secondary | ICD-10-CM | POA: Diagnosis not present

## 2021-09-05 DIAGNOSIS — I10 Essential (primary) hypertension: Secondary | ICD-10-CM | POA: Diagnosis not present

## 2021-09-11 DIAGNOSIS — E785 Hyperlipidemia, unspecified: Secondary | ICD-10-CM | POA: Diagnosis not present

## 2021-10-16 ENCOUNTER — Other Ambulatory Visit: Payer: Self-pay | Admitting: Thoracic Surgery (Cardiothoracic Vascular Surgery)

## 2021-10-17 ENCOUNTER — Other Ambulatory Visit: Payer: Self-pay | Admitting: Thoracic Surgery (Cardiothoracic Vascular Surgery)

## 2021-10-17 DIAGNOSIS — I7121 Aneurysm of the ascending aorta, without rupture: Secondary | ICD-10-CM

## 2021-11-28 ENCOUNTER — Ambulatory Visit
Admission: RE | Admit: 2021-11-28 | Discharge: 2021-11-28 | Disposition: A | Discharge status: Home / Self Care | Payer: Medicare Other | Source: Ambulatory Visit | Attending: Thoracic Surgery (Cardiothoracic Vascular Surgery) | Admitting: Thoracic Surgery (Cardiothoracic Vascular Surgery)

## 2021-11-28 DIAGNOSIS — K449 Diaphragmatic hernia without obstruction or gangrene: Secondary | ICD-10-CM | POA: Diagnosis not present

## 2021-11-28 DIAGNOSIS — I7121 Aneurysm of the ascending aorta, without rupture: Secondary | ICD-10-CM

## 2021-11-28 DIAGNOSIS — K760 Fatty (change of) liver, not elsewhere classified: Secondary | ICD-10-CM | POA: Diagnosis not present

## 2021-11-28 DIAGNOSIS — I712 Thoracic aortic aneurysm, without rupture, unspecified: Secondary | ICD-10-CM | POA: Diagnosis not present

## 2021-11-28 MED ORDER — IOPAMIDOL (ISOVUE-370) INJECTION 76%
75.0000 mL | Freq: Once | INTRAVENOUS | Status: AC | PRN
  Administered 2021-11-28: 75 mL via INTRAVENOUS

## 2021-12-04 ENCOUNTER — Ambulatory Visit: Payer: Medicare Other | Admitting: Surgical

## 2021-12-04 VITALS — BP 193/91 | HR 68 | Resp 20 | Ht 72.0 in | Wt 196.0 lb

## 2021-12-04 DIAGNOSIS — I7121 Aneurysm of the ascending aorta, without rupture: Secondary | ICD-10-CM

## 2021-12-04 NOTE — Progress Notes (Signed)
Subjective:     Patient ID: Timothy Weaver, male    DOB: January 27, 1941, 81 y.o.   MRN: [id number]    HPI Patient is in today for ongoing surveillance of his thoracic aortic aneurysm.  Values have remained stable.  He remains asymptomatic in regards to chest pain or shortness of breath.  His overall health he describes as good he does have some issues related to arthritis and occasional ear difficulties related to his previous parotid surgery.  Otherwise he is doing well general wellbeing and also describes some improvements in his diet.  Past Medical History:  Diagnosis Date   Arthritis    Facial abscess 01/08/2017   History of radiation therapy 04/10/17- 05/21/17   Left Parotid treated to 60 Gy with 30 fractions.    Shingles 2004   Skin cancer 2019   removal, right superior knee.    Squamous cell carcinoma of parotid ([employer]) 02/11/2017   Patient Active Problem List   Diagnosis Date Noted   Cancer of skin of face 03/19/2017    Current Outpatient Medications  Medication Instructions   ascorbic acid (VITAMIN C) 500 mg, Oral, Daily   aspirin EC 81 mg, Oral, Daily, Swallow whole.   cholecalciferol (VITAMIN D3) 1,000 Units, Oral, Daily   Cod Liver Oil 1,000 mg, Oral, Daily   L-Lysine 500 MG TABS 500 tablets, Oral   metoprolol succinate (TOPROL XL) 25 mg, Oral, Daily   Multiple Vitamins-Minerals (MULTIVITAMIN WITH MINERALS) tablet 1 tablet, Oral, Daily   vitamin E 200 Units, Oral, Daily    Social History   Occupational History   Not on file  Tobacco Use   Smoking status: Never   Smokeless tobacco: Never  Vaping Use   Vaping Use: Never used  Substance and Sexual Activity   Alcohol use: Yes    Comment: occasional glass of wine at dinner   Drug use: No   Sexual activity: Not on file         Objective:       12/04/2021    1:36 PM 11/27/2020    1:00 PM 10/12/2020   11:40 AM  Vitals with BMI  Height '6\' 0"'$  '6\' 0"'$  '6\' 0"'$   Weight 196 lbs 195 lbs 195 lbs 10 oz  BMI 26.58  69.62 95.28  Systolic 413 244 010  Diastolic 91 83 95  Pulse 68 64 69        Physical Exam Constitutional:      Appearance: Normal appearance. He is normal weight.  HENT:     Head: Normocephalic and atraumatic.  Neck:     Vascular: No carotid bruit.  Cardiovascular:     Rate and Rhythm: Normal rate and regular rhythm.     Pulses: Normal pulses.     Heart sounds: No murmur heard.    No gallop.  Pulmonary:     Effort: Pulmonary effort is normal.     Breath sounds: Normal breath sounds.  Abdominal:     General: Abdomen is flat.     Palpations: Abdomen is soft.  Skin:    General: Skin is warm and dry.  Neurological:     General: No focal deficit present.     Mental Status: He is alert.  Psychiatric:        Mood and Affect: Mood normal.        Behavior: Behavior normal.        Thought Content: Thought content normal.    Study Result  Narrative &  Impression  CLINICAL DATA:  Thoracic aortic aneurysm   EXAM: CT ANGIOGRAPHY CHEST WITH CONTRAST   TECHNIQUE: Multidetector CT imaging of the chest was performed using the standard protocol during bolus administration of intravenous contrast. Multiplanar CT image reconstructions and MIPs were obtained to evaluate the vascular anatomy.   RADIATION DOSE REDUCTION: This exam was performed according to the departmental dose-optimization program which includes automated exposure control, adjustment of the mA and/or kV according to patient size and/or use of iterative reconstruction technique.   CONTRAST:  47m ISOVUE-370 IOPAMIDOL (ISOVUE-370) INJECTION 76%   COMPARISON:  Previous studies including the examination of 11/27/2020   FINDINGS: Cardiovascular: There is homogeneous enhancement in thoracic aorta. There is ectasia of ascending thoracic aorta measuring 4.1 cm. Major branches of thoracic aorta appear patent. There are no intraluminal filling defects in central pulmonary artery branches. Evaluation of small  peripheral branches is limited by less than optimal contrast enhancement.   Mediastinum/Nodes: No significant lymphadenopathy is seen.   Lungs/Pleura: There is no focal pulmonary consolidation. There are no discrete lung nodules. There is no pleural effusion or pneumothorax.   Upper Abdomen: There is fatty infiltration in liver. Small hiatal hernia is seen.   Musculoskeletal: No acute findings are seen.   Review of the MIP images confirms the above findings.   IMPRESSION: There is no evidence of aortic dissection. There is ectasia of ascending thoracic aorta measuring 4.1 cm. Recommend annual imaging followup by CTA or MRA. This recommendation follows 2010 ACCF/AHA/AATS/ACR/ASA/SCA/SCAI/SIR/STS/SVM Guidelines for the Diagnosis and Management of Patients with Thoracic Aortic Disease. Circulation. 2010; 121:: E081-K481 Aortic aneurysm NOS (ICD10-I71.9)   There is no evidence of central pulmonary artery embolism. There is no focal pulmonary consolidation.   Fatty liver.  Small hiatal hernia.     Electronically Signed   By: PElmer PickerM.D.   On: 11/28/2021 11:16   No results found for any visits on 12/04/21.      Assessment & Plan:   Stable ascending thoracic aneurysm as described above.  His blood pressure on today's visit is elevated.  He does understand the importance of this and has obtained a recent primary care provider.  He keeps a diary of blood pressure at home and showed me.  Systolic blood pressures typically in the 120-140 range.  I told him to continue his blood pressure diary and keep in close contact with primary care should these values become significantly abnormal in a routine manner.  We discussed routine lifestyle and nutrition benefits in regards to general health and disease prevention.  We will see the patient again in 1 year with a repeat CTA of the chest.   Problem List Items Addressed This Visit   None   No orders of the defined types were  placed in this encounter.   No follow-ups on file.  WJohn Giovanni PA-C

## 2021-12-04 NOTE — Patient Instructions (Signed)
Keep good control of your blood pressure.  Continue to keep a diary.  Alert primary care should values be persistently elevated as discussed. Encourage healthy lifestyle choices and healthy nutrition choices

## 2021-12-25 DIAGNOSIS — H401131 Primary open-angle glaucoma, bilateral, mild stage: Secondary | ICD-10-CM | POA: Diagnosis not present

## 2022-01-02 DIAGNOSIS — L821 Other seborrheic keratosis: Secondary | ICD-10-CM | POA: Diagnosis not present

## 2022-01-02 DIAGNOSIS — L814 Other melanin hyperpigmentation: Secondary | ICD-10-CM | POA: Diagnosis not present

## 2022-01-02 DIAGNOSIS — L570 Actinic keratosis: Secondary | ICD-10-CM | POA: Diagnosis not present

## 2022-01-02 DIAGNOSIS — Z85828 Personal history of other malignant neoplasm of skin: Secondary | ICD-10-CM | POA: Diagnosis not present

## 2022-01-02 DIAGNOSIS — D0461 Carcinoma in situ of skin of right upper limb, including shoulder: Secondary | ICD-10-CM | POA: Diagnosis not present

## 2022-01-11 DIAGNOSIS — D101 Benign neoplasm of tongue: Secondary | ICD-10-CM | POA: Diagnosis not present

## 2022-05-02 DIAGNOSIS — K080 Exfoliation of teeth due to systemic causes: Secondary | ICD-10-CM | POA: Diagnosis not present

## 2022-06-13 DIAGNOSIS — H9193 Unspecified hearing loss, bilateral: Secondary | ICD-10-CM | POA: Diagnosis not present

## 2022-06-13 DIAGNOSIS — I712 Thoracic aortic aneurysm, without rupture, unspecified: Secondary | ICD-10-CM | POA: Diagnosis not present

## 2022-06-13 DIAGNOSIS — E785 Hyperlipidemia, unspecified: Secondary | ICD-10-CM | POA: Diagnosis not present

## 2022-06-13 DIAGNOSIS — Z0000 Encounter for general adult medical examination without abnormal findings: Secondary | ICD-10-CM | POA: Diagnosis not present

## 2022-06-13 DIAGNOSIS — R030 Elevated blood-pressure reading, without diagnosis of hypertension: Secondary | ICD-10-CM | POA: Diagnosis not present

## 2022-06-13 DIAGNOSIS — R739 Hyperglycemia, unspecified: Secondary | ICD-10-CM | POA: Diagnosis not present

## 2022-06-20 NOTE — Progress Notes (Signed)
[facility] Quality Team Note  Name: HANNAH WESTLAND Date of Birth: 04/20/1940 MRN: [id number] Date: 06/20/2022  [facility] Quality Team has reviewed this patient's chart, please see recommendations below:  [facility] Quality Other; (CBP- CONTROLLING BLOOD PRESSURE. PATIENT DUE FOR BLOOD PRESSURE READING. MUST BE LESS THAN 140/90 FOR GAP CLOSURE.)

## 2022-06-25 DIAGNOSIS — H401131 Primary open-angle glaucoma, bilateral, mild stage: Secondary | ICD-10-CM | POA: Diagnosis not present

## 2022-07-03 DIAGNOSIS — D3617 Benign neoplasm of peripheral nerves and autonomic nervous system of trunk, unspecified: Secondary | ICD-10-CM | POA: Diagnosis not present

## 2022-07-03 DIAGNOSIS — Z85828 Personal history of other malignant neoplasm of skin: Secondary | ICD-10-CM | POA: Diagnosis not present

## 2022-07-03 DIAGNOSIS — L570 Actinic keratosis: Secondary | ICD-10-CM | POA: Diagnosis not present

## 2022-07-03 DIAGNOSIS — D3612 Benign neoplasm of peripheral nerves and autonomic nervous system, upper limb, including shoulder: Secondary | ICD-10-CM | POA: Diagnosis not present

## 2022-07-03 DIAGNOSIS — L814 Other melanin hyperpigmentation: Secondary | ICD-10-CM | POA: Diagnosis not present

## 2022-07-17 DIAGNOSIS — C44300 Unspecified malignant neoplasm of skin of unspecified part of face: Secondary | ICD-10-CM | POA: Diagnosis not present

## 2022-10-01 DIAGNOSIS — E7800 Pure hypercholesterolemia, unspecified: Secondary | ICD-10-CM | POA: Diagnosis not present

## 2022-10-01 DIAGNOSIS — E785 Hyperlipidemia, unspecified: Secondary | ICD-10-CM | POA: Diagnosis not present

## 2022-10-15 ENCOUNTER — Other Ambulatory Visit: Payer: Self-pay | Admitting: Thoracic Surgery (Cardiothoracic Vascular Surgery)

## 2022-10-15 DIAGNOSIS — I7121 Aneurysm of the ascending aorta, without rupture: Secondary | ICD-10-CM

## 2022-11-19 DIAGNOSIS — K080 Exfoliation of teeth due to systemic causes: Secondary | ICD-10-CM | POA: Diagnosis not present

## 2022-12-11 NOTE — Progress Notes (Signed)
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  His aneurysm was initially diagnosed back in 2019 during his cancer workup.  He has been followed since that time by our office and presents for his 1 year repeat CTA of his chest.  Overall  he is doing well.  He states that he has finally gotten a family physician and has been following with him regularly.  He states his biggest concern is keeping up with his skin checks as he does not want to have to go through any further extensive surgery.  He continues to work and remains active.  He is trying to lose weight as able and get below 190 lbs.    Current Outpatient Medications on File Prior to Visit  Medication Sig Dispense Refill   aspirin EC 81 MG tablet Take 1 tablet (81 mg total) by mouth daily. Swallow whole. 30 tablet 1   cholecalciferol (VITAMIN D3) 25 MCG (1000 UNIT) tablet Take 1,000 Units by mouth daily.     Cod Liver Oil 1000 MG CAPS Take 1,000 mg by mouth daily.      L-Lysine 500 MG TABS Take 500 tablets by mouth.      metoprolol succinate (TOPROL XL) 25 MG 24 hr tablet Take 1 tablet (25 mg total) by mouth daily. 30 tablet 6   Multiple Vitamins-Minerals (MULTIVITAMIN WITH MINERALS) tablet Take 1 tablet by mouth daily.      vitamin C (ASCORBIC ACID) 500 MG tablet Take 500 mg by mouth daily.      vitamin E 200 UNIT capsule Take 200 Units by mouth daily.      No current facility-administered medications on file prior to visit.   Physical Exam  BP (!) 161/84   Pulse 71   Resp 18   Ht 6' (1.829 m)   Wt 198 lb (89.8 kg)   SpO2 98% Comment: RA  BMI 26.85 kg/m   Gen: NAD Heart; RRR Lungs: CTA bilaterally Neck: no bruit Ext: no  edema Neuro: grossly intact  CTA Results:  EXAM: CT ANGIOGRAPHY CHEST WITH CONTRAST   TECHNIQUE: Multidetector CT imaging of the chest was performed using the standard protocol during bolus administration of intravenous contrast. Multiplanar CT image reconstructions and MIPs were obtained to evaluate the vascular anatomy.   RADIATION DOSE REDUCTION: This exam was performed according to the departmental dose-optimization program which includes automated exposure control, adjustment of the mA and/or kV according to patient size and/or use of iterative reconstruction technique.   CONTRAST:  75mL ISOVUE-370 IOPAMIDOL (ISOVUE-370) INJECTION 76%   COMPARISON:  Chest CT dated 11/28/2021.   FINDINGS: Cardiovascular: There is no cardiomegaly or pericardial effusion. Top-normal ascending aorta measuring up to 4 cm in diameter. No aortic dissection. The origins of the great vessels of the aortic arch and the central pulmonary arteries appear patent.   Mediastinum/Nodes: No hilar or mediastinal adenopathy. The esophagus is grossly unremarkable. No mediastinal fluid collection.   Lungs/Pleura: No focal consolidation, pleural effusion, or pneumothorax. The central airways are patent.   Upper Abdomen: No acute abnormality.   Musculoskeletal: No acute osseous pathology. Osteopenia  with degenerative changes.   Review of the MIP images confirms the above findings.   IMPRESSION: 1. No acute intrathoracic pathology. No aortic dissection. 2. Top-normal ascending aorta measuring up to 4 cm in diameter. Recommend annual imaging followup by CTA or MRA. This recommendation follows 2010 ACCF/AHA/AATS/ACR/ASA/SCA/SCAI/SIR/STS/SVM Guidelines for the Diagnosis and Management of Patients with Thoracic Aortic Disease. Circulation. 2010; 121: I433-I951. Aortic aneurysm NOS (ICD10-I71.9)     Electronically Signed   By: Elgie Collard M.D.   On: 12/12/2022 17:09    A/P:  Ascending Aortic  Aneurysm-measuring 4.0 cm which is upper limits of normal..  This was incidentally found during his cancer workup.  This has been stable over the past 5 years.  I think it is reasonable to scan every 2 years at this point.  The patient was counseled on importance of monitoring closely for warning signs and can call us at any time should need arise HTN- documented white coat syndrome, patient with elevated BP today.. he states his primary is considering starting a low dose antihypertensive.. he brought a log with him that shows mostly well controlled BP. HLD H/O Parotid Gland Cancer due to Squamous Cell Carcinoma   Patient was counseled on importance of Blood Pressure Control.  Despite Medical intervention if the patient notices persistently elevated blood pressure readings.  They are instructed to contact their Primary Care Physician  Please avoid use of Fluoroquinolones as this can potentially increase your risk of Aortic Rupture and/or Dissection  Patient educated on signs and symptoms of Aortic Dissection, handout also provided in AVS  Abimbola Aki, PA-C 12/11/22

## 2022-12-12 ENCOUNTER — Ambulatory Visit
Admission: RE | Admit: 2022-12-12 | Discharge: 2022-12-12 | Disposition: A | Discharge status: Home / Self Care | Payer: Medicare Other | Source: Ambulatory Visit | Attending: Thoracic Surgery (Cardiothoracic Vascular Surgery) | Admitting: Thoracic Surgery (Cardiothoracic Vascular Surgery)

## 2022-12-12 DIAGNOSIS — I7121 Aneurysm of the ascending aorta, without rupture: Secondary | ICD-10-CM

## 2022-12-12 MED ORDER — IOPAMIDOL (ISOVUE-370) INJECTION 76%
500.0000 mL | Freq: Once | INTRAVENOUS | Status: AC | PRN
  Administered 2022-12-12: 75 mL via INTRAVENOUS

## 2022-12-16 ENCOUNTER — Ambulatory Visit: Payer: Medicare Other | Admitting: Physician Assistant

## 2022-12-16 VITALS — BP 161/84 | HR 71 | Resp 18 | Ht 72.0 in | Wt 198.0 lb

## 2022-12-16 DIAGNOSIS — I7121 Aneurysm of the ascending aorta, without rupture: Secondary | ICD-10-CM

## 2022-12-24 DIAGNOSIS — H401131 Primary open-angle glaucoma, bilateral, mild stage: Secondary | ICD-10-CM | POA: Diagnosis not present

## 2023-01-09 DIAGNOSIS — L570 Actinic keratosis: Secondary | ICD-10-CM | POA: Diagnosis not present

## 2023-01-09 DIAGNOSIS — C44622 Squamous cell carcinoma of skin of right upper limb, including shoulder: Secondary | ICD-10-CM | POA: Diagnosis not present

## 2023-01-09 DIAGNOSIS — L814 Other melanin hyperpigmentation: Secondary | ICD-10-CM | POA: Diagnosis not present

## 2023-01-09 DIAGNOSIS — L821 Other seborrheic keratosis: Secondary | ICD-10-CM | POA: Diagnosis not present

## 2023-01-09 DIAGNOSIS — C44329 Squamous cell carcinoma of skin of other parts of face: Secondary | ICD-10-CM | POA: Diagnosis not present

## 2023-01-09 DIAGNOSIS — D485 Neoplasm of uncertain behavior of skin: Secondary | ICD-10-CM | POA: Diagnosis not present

## 2023-01-09 DIAGNOSIS — Z85828 Personal history of other malignant neoplasm of skin: Secondary | ICD-10-CM | POA: Diagnosis not present

## 2023-05-27 DIAGNOSIS — K080 Exfoliation of teeth due to systemic causes: Secondary | ICD-10-CM | POA: Diagnosis not present

## 2023-06-24 DIAGNOSIS — H401131 Primary open-angle glaucoma, bilateral, mild stage: Secondary | ICD-10-CM | POA: Diagnosis not present

## 2023-07-10 DIAGNOSIS — D3612 Benign neoplasm of peripheral nerves and autonomic nervous system, upper limb, including shoulder: Secondary | ICD-10-CM | POA: Diagnosis not present

## 2023-07-10 DIAGNOSIS — C44622 Squamous cell carcinoma of skin of right upper limb, including shoulder: Secondary | ICD-10-CM | POA: Diagnosis not present

## 2023-07-10 DIAGNOSIS — D225 Melanocytic nevi of trunk: Secondary | ICD-10-CM | POA: Diagnosis not present

## 2023-07-10 DIAGNOSIS — L570 Actinic keratosis: Secondary | ICD-10-CM | POA: Diagnosis not present

## 2023-07-10 DIAGNOSIS — D3617 Benign neoplasm of peripheral nerves and autonomic nervous system of trunk, unspecified: Secondary | ICD-10-CM | POA: Diagnosis not present

## 2023-07-10 DIAGNOSIS — Z85828 Personal history of other malignant neoplasm of skin: Secondary | ICD-10-CM | POA: Diagnosis not present

## 2023-12-08 DIAGNOSIS — K080 Exfoliation of teeth due to systemic causes: Secondary | ICD-10-CM | POA: Diagnosis not present

## 2023-12-25 DIAGNOSIS — H401131 Primary open-angle glaucoma, bilateral, mild stage: Secondary | ICD-10-CM | POA: Diagnosis not present

## 2024-01-20 DIAGNOSIS — D3617 Benign neoplasm of peripheral nerves and autonomic nervous system of trunk, unspecified: Secondary | ICD-10-CM | POA: Diagnosis not present

## 2024-01-20 DIAGNOSIS — D3612 Benign neoplasm of peripheral nerves and autonomic nervous system, upper limb, including shoulder: Secondary | ICD-10-CM | POA: Diagnosis not present

## 2024-01-20 DIAGNOSIS — L814 Other melanin hyperpigmentation: Secondary | ICD-10-CM | POA: Diagnosis not present

## 2024-01-20 DIAGNOSIS — L570 Actinic keratosis: Secondary | ICD-10-CM | POA: Diagnosis not present

## 2024-01-20 DIAGNOSIS — Z85828 Personal history of other malignant neoplasm of skin: Secondary | ICD-10-CM | POA: Diagnosis not present

## 2024-01-20 DIAGNOSIS — B078 Other viral warts: Secondary | ICD-10-CM | POA: Diagnosis not present
